# Patient Record
Sex: Male | Born: 1940 | Race: White | Hispanic: No | Marital: Married | Smoking: Former smoker
Health system: Southern US, Community
[De-identification: ages and names within clinical notes are randomized; demographics above are authoritative.]

## PROBLEM LIST (undated history)

## (undated) DIAGNOSIS — I10 Essential (primary) hypertension: Secondary | ICD-10-CM

## (undated) DIAGNOSIS — I4891 Unspecified atrial fibrillation: Secondary | ICD-10-CM

## (undated) DIAGNOSIS — I219 Acute myocardial infarction, unspecified: Secondary | ICD-10-CM

## (undated) DIAGNOSIS — I2510 Atherosclerotic heart disease of native coronary artery without angina pectoris: Secondary | ICD-10-CM

## (undated) DIAGNOSIS — L409 Psoriasis, unspecified: Secondary | ICD-10-CM

## (undated) DIAGNOSIS — E785 Hyperlipidemia, unspecified: Secondary | ICD-10-CM

## (undated) DIAGNOSIS — G4730 Sleep apnea, unspecified: Secondary | ICD-10-CM

## (undated) DIAGNOSIS — K635 Polyp of colon: Secondary | ICD-10-CM

## (undated) DIAGNOSIS — F17201 Nicotine dependence, unspecified, in remission: Secondary | ICD-10-CM

## (undated) DIAGNOSIS — I428 Other cardiomyopathies: Secondary | ICD-10-CM

## (undated) DIAGNOSIS — N189 Chronic kidney disease, unspecified: Secondary | ICD-10-CM

## (undated) DIAGNOSIS — E119 Type 2 diabetes mellitus without complications: Secondary | ICD-10-CM

## (undated) HISTORY — DX: Hyperlipidemia, unspecified: E78.5

## (undated) HISTORY — DX: Essential (primary) hypertension: I10

## (undated) HISTORY — PX: PARTIAL NEPHRECTOMY: SHX414

## (undated) HISTORY — PX: CARDIAC DEFIBRILLATOR PLACEMENT: SHX171

## (undated) HISTORY — DX: Nicotine dependence, unspecified, in remission: F17.201

## (undated) HISTORY — DX: Other cardiomyopathies: I42.8

## (undated) HISTORY — DX: Type 2 diabetes mellitus without complications: E11.9

## (undated) HISTORY — DX: Atherosclerotic heart disease of native coronary artery without angina pectoris: I25.10

## (undated) HISTORY — DX: Polyp of colon: K63.5

## (undated) HISTORY — DX: Unspecified atrial fibrillation: I48.91

## (undated) HISTORY — DX: Psoriasis, unspecified: L40.9

## (undated) HISTORY — DX: Chronic kidney disease, unspecified: N18.9

---

## 1993-10-11 DIAGNOSIS — F17201 Nicotine dependence, unspecified, in remission: Secondary | ICD-10-CM

## 1993-10-11 HISTORY — DX: Nicotine dependence, unspecified, in remission: F17.201

## 2004-01-31 ENCOUNTER — Inpatient Hospital Stay (HOSPITAL_COMMUNITY): Admission: AD | Admit: 2004-01-31 | Discharge: 2004-02-06 | Payer: Self-pay | Admitting: *Deleted

## 2004-08-26 ENCOUNTER — Ambulatory Visit: Payer: Self-pay | Admitting: *Deleted

## 2004-09-16 ENCOUNTER — Ambulatory Visit: Payer: Self-pay | Admitting: *Deleted

## 2004-09-25 ENCOUNTER — Ambulatory Visit: Payer: Self-pay | Admitting: *Deleted

## 2004-10-11 DIAGNOSIS — I10 Essential (primary) hypertension: Secondary | ICD-10-CM

## 2004-10-11 HISTORY — DX: Essential (primary) hypertension: I10

## 2004-10-30 ENCOUNTER — Ambulatory Visit: Payer: Self-pay | Admitting: *Deleted

## 2004-11-11 ENCOUNTER — Ambulatory Visit: Payer: Self-pay | Admitting: Cardiology

## 2004-11-25 ENCOUNTER — Ambulatory Visit: Payer: Self-pay | Admitting: *Deleted

## 2004-12-15 ENCOUNTER — Ambulatory Visit: Payer: Self-pay | Admitting: Cardiology

## 2005-01-14 ENCOUNTER — Ambulatory Visit: Payer: Self-pay | Admitting: Cardiology

## 2005-02-17 ENCOUNTER — Ambulatory Visit: Payer: Self-pay | Admitting: *Deleted

## 2005-03-22 ENCOUNTER — Inpatient Hospital Stay (HOSPITAL_COMMUNITY): Admission: EM | Admit: 2005-03-22 | Discharge: 2005-03-28 | Payer: Self-pay | Admitting: Emergency Medicine

## 2005-03-23 ENCOUNTER — Ambulatory Visit: Payer: Self-pay | Admitting: Internal Medicine

## 2005-03-25 ENCOUNTER — Ambulatory Visit: Payer: Self-pay | Admitting: Cardiology

## 2005-04-07 ENCOUNTER — Ambulatory Visit: Payer: Self-pay | Admitting: *Deleted

## 2005-06-01 ENCOUNTER — Inpatient Hospital Stay (HOSPITAL_COMMUNITY): Admission: RE | Admit: 2005-06-01 | Discharge: 2005-06-04 | Payer: Self-pay | Admitting: Urology

## 2005-06-01 ENCOUNTER — Encounter (INDEPENDENT_AMBULATORY_CARE_PROVIDER_SITE_OTHER): Payer: Self-pay | Admitting: *Deleted

## 2005-07-27 ENCOUNTER — Ambulatory Visit: Payer: Self-pay | Admitting: *Deleted

## 2005-07-28 ENCOUNTER — Ambulatory Visit (HOSPITAL_COMMUNITY): Admission: RE | Admit: 2005-07-28 | Discharge: 2005-07-28 | Payer: Self-pay | Admitting: *Deleted

## 2005-07-28 ENCOUNTER — Ambulatory Visit: Payer: Self-pay | Admitting: *Deleted

## 2005-08-04 ENCOUNTER — Ambulatory Visit: Payer: Self-pay | Admitting: *Deleted

## 2005-08-24 ENCOUNTER — Inpatient Hospital Stay (HOSPITAL_COMMUNITY): Admission: RE | Admit: 2005-08-24 | Discharge: 2005-08-25 | Payer: Self-pay | Admitting: Internal Medicine

## 2005-08-24 ENCOUNTER — Ambulatory Visit: Payer: Self-pay | Admitting: Internal Medicine

## 2005-09-09 ENCOUNTER — Ambulatory Visit: Payer: Self-pay

## 2005-11-30 ENCOUNTER — Ambulatory Visit: Payer: Self-pay | Admitting: Internal Medicine

## 2006-03-01 ENCOUNTER — Ambulatory Visit: Payer: Self-pay | Admitting: Internal Medicine

## 2006-05-17 ENCOUNTER — Ambulatory Visit: Payer: Self-pay | Admitting: *Deleted

## 2006-05-26 ENCOUNTER — Ambulatory Visit: Payer: Self-pay

## 2006-07-13 IMAGING — CR DG CHEST 1V PORT
1 series · 1 of 1 positions shown · non-contrast
Comparison: 03/23/2005

CLINICAL DATA: Shortness of breath

PORTABLE CHEST - 1 VIEW:

[view not recorded]
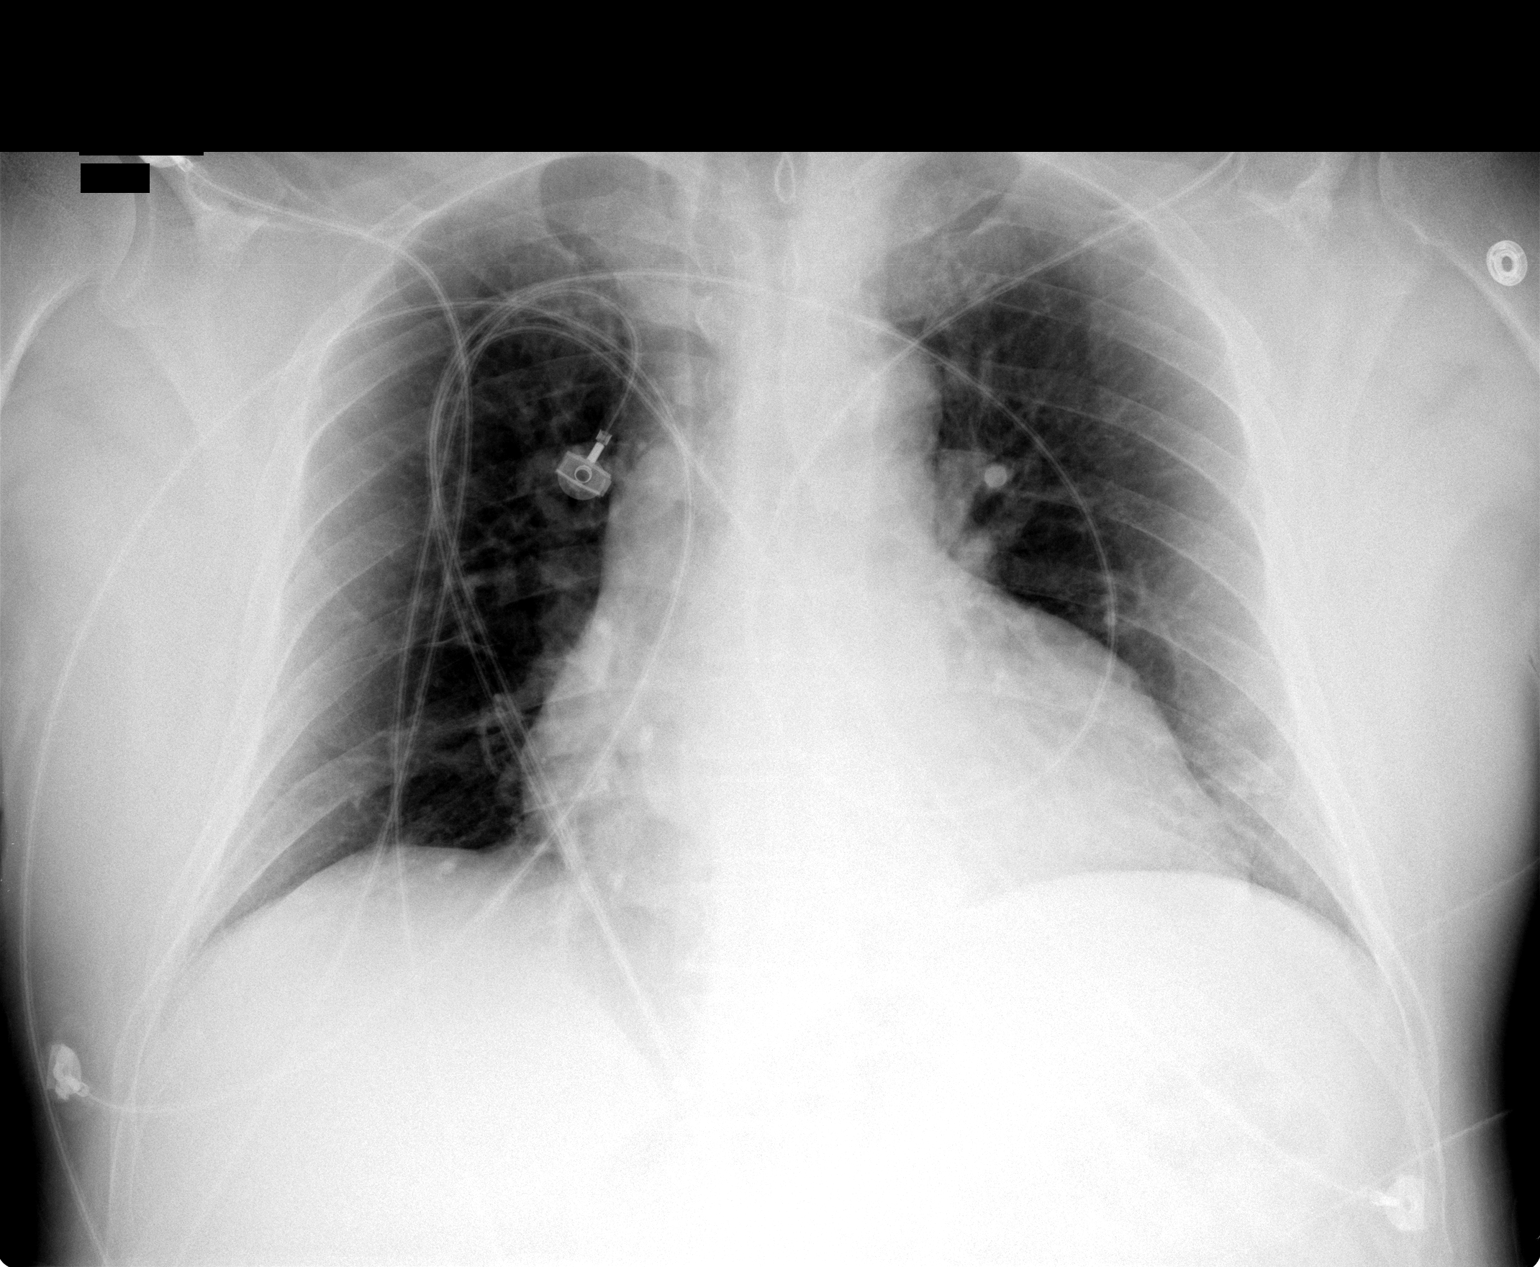

[1 of 1 positions shown; findings below may reference images not displayed]

FINDINGS: There is cardiomegaly. Low lung volumes are noted with no focal
airspace opacity. No effusion.
IMPRESSION: Cardiomegaly. Low lung volumes. No active disease.

## 2006-08-12 ENCOUNTER — Ambulatory Visit: Payer: Self-pay

## 2006-08-30 ENCOUNTER — Ambulatory Visit: Payer: Self-pay | Admitting: Internal Medicine

## 2006-09-07 ENCOUNTER — Ambulatory Visit: Payer: Self-pay | Admitting: Internal Medicine

## 2006-11-30 ENCOUNTER — Ambulatory Visit: Payer: Self-pay | Admitting: Internal Medicine

## 2006-12-14 IMAGING — CR DG CHEST 2V
2 series · 2 of 2 positions shown · non-contrast
Comparison: none

CLINICAL DATA: Left ventricular dysfunction. Hypertension. Reformed smoker. Pre-ICD lead placement.
 CHEST- 2 VIEW:

[view not recorded (1 of 2)]
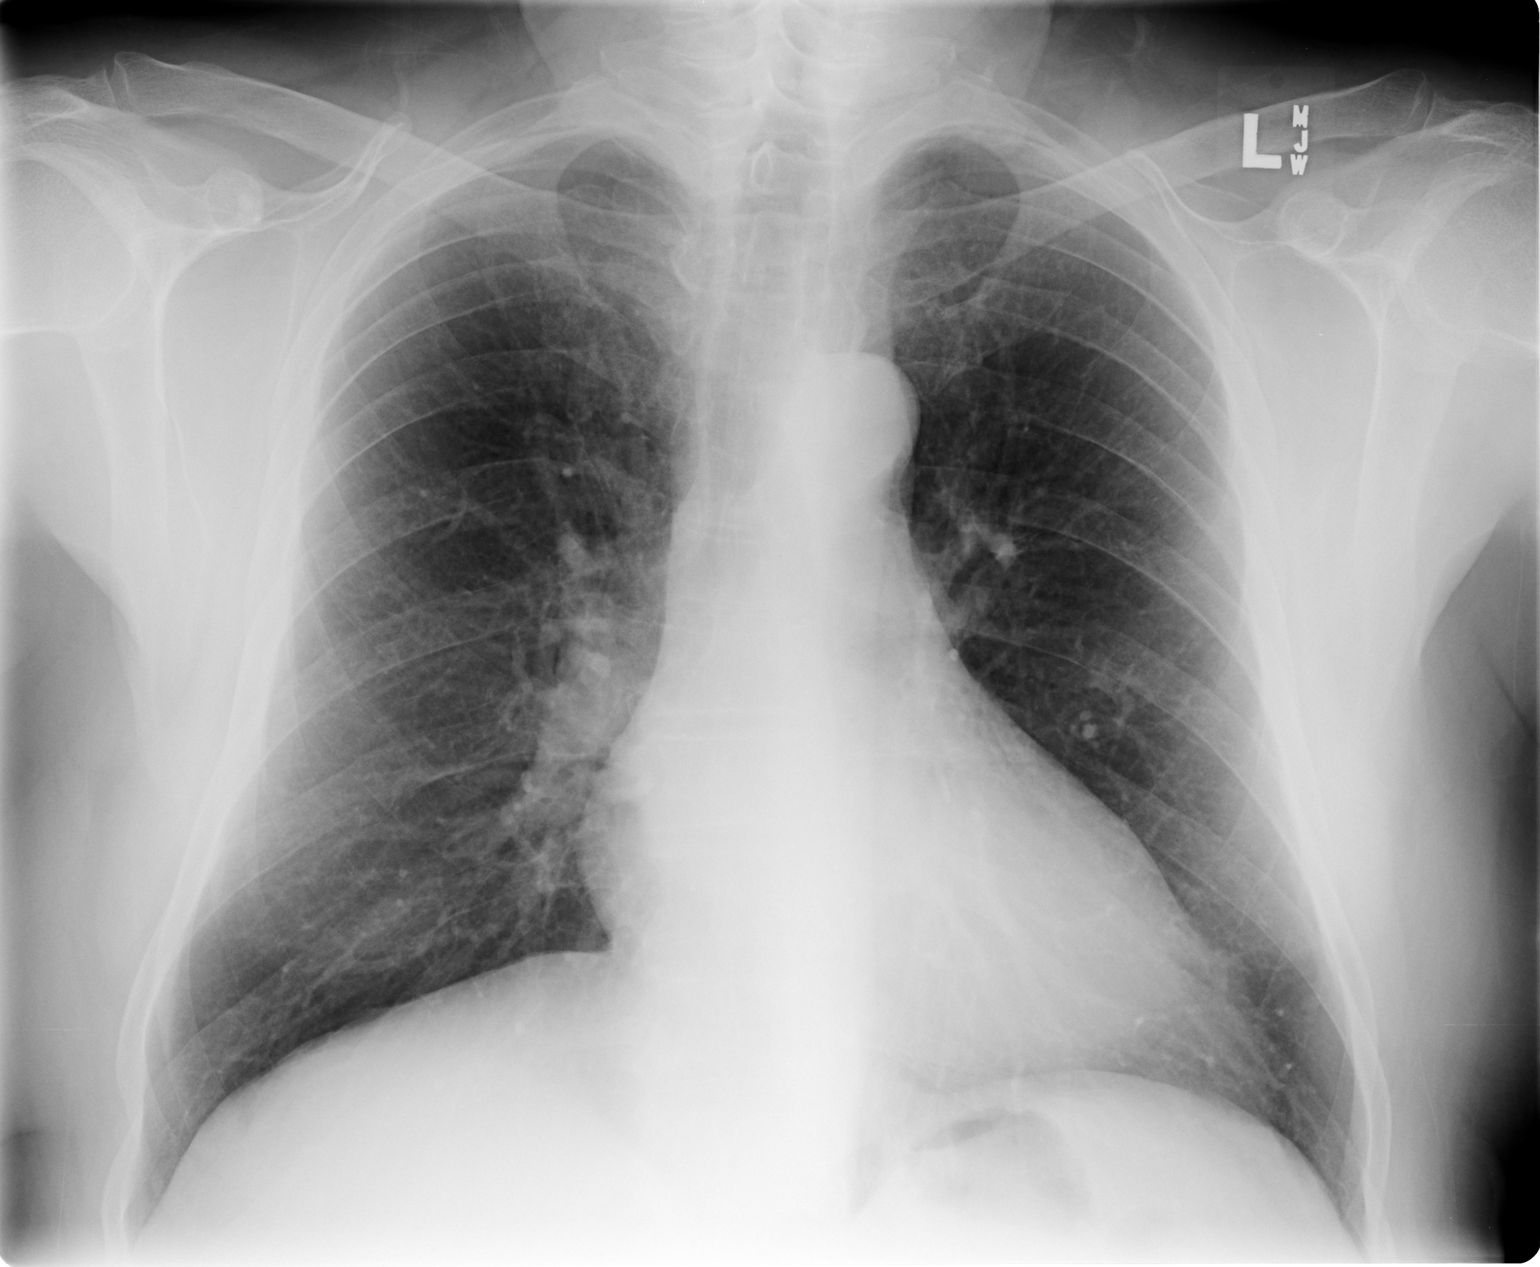

[view not recorded (2 of 2)]
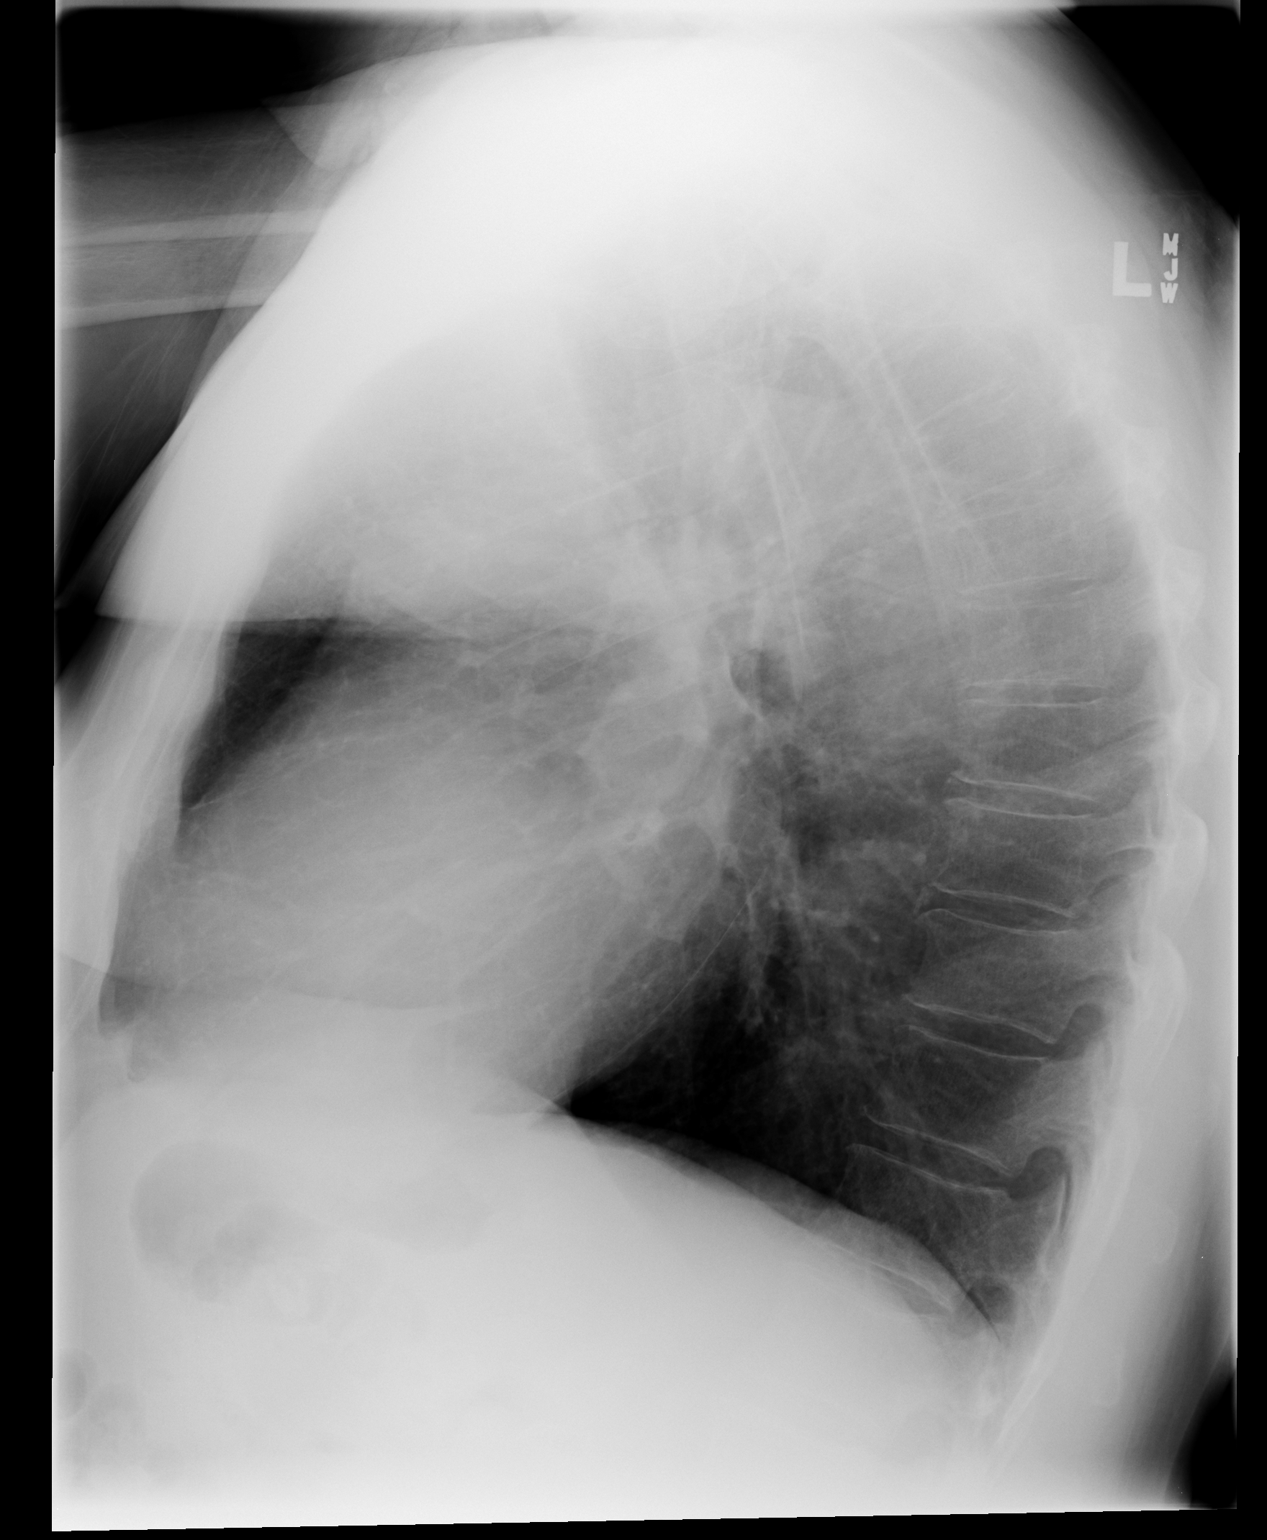

[2 of 2 positions shown; findings below may reference images not displayed]

FINDINGS: PA and lateral views of the chest are made and are compared to a previous portable study of 03/24/05 at 1091 hours and shows improved lung aeration.  There is noted mild diffuse peribronchial thickening with some flattening of the hemidiaphragms and increased AP diameter of the chest consistent with mild COPD. The heart, and mediastinum are within normal limits.  There is no edema, fusion or pneumothorax.  The bones appear to be within the limits of normal.
IMPRESSION: Mild COPD. No evidence of active cardiopulmonary disease.

## 2006-12-15 IMAGING — CR DG CHEST 2V
2 series · 2 of 2 positions shown · non-contrast
Comparison: none

CLINICAL DATA: post-AICD; some soreness
 [id number] VIEWS:

[w chest pa]
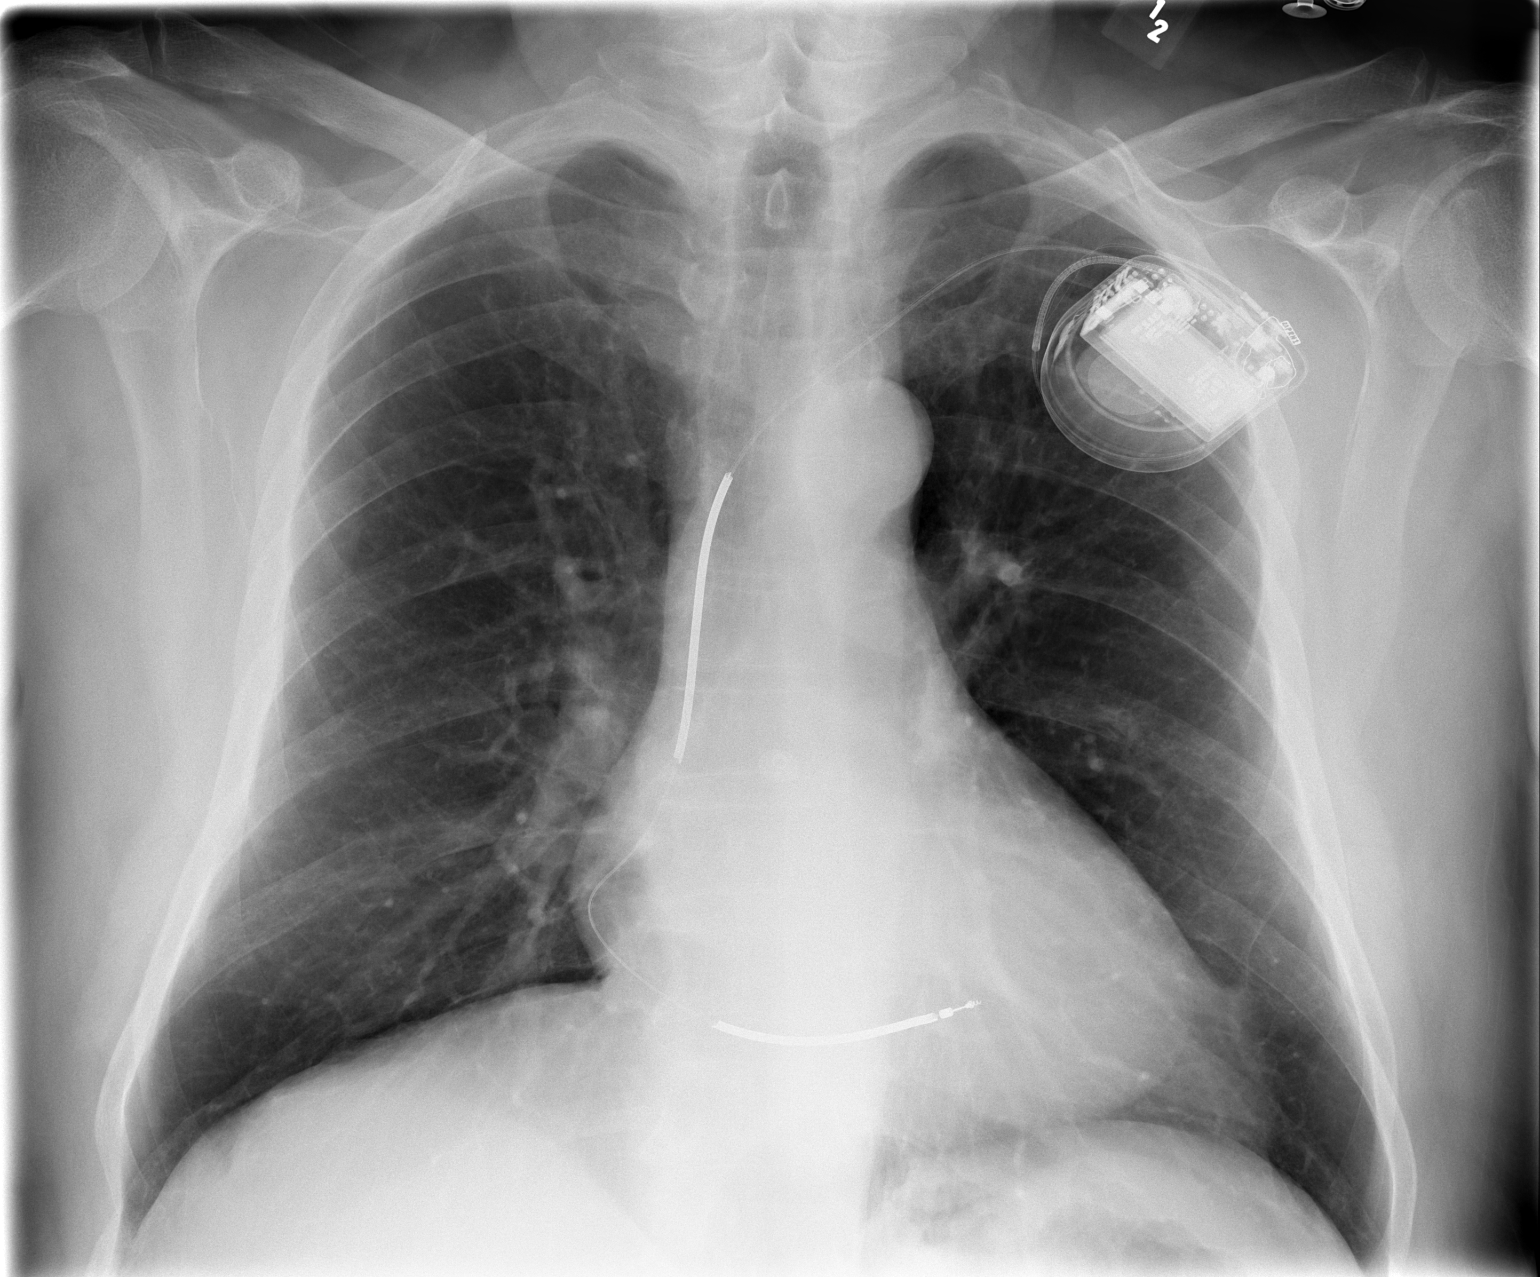

[w chest lat]
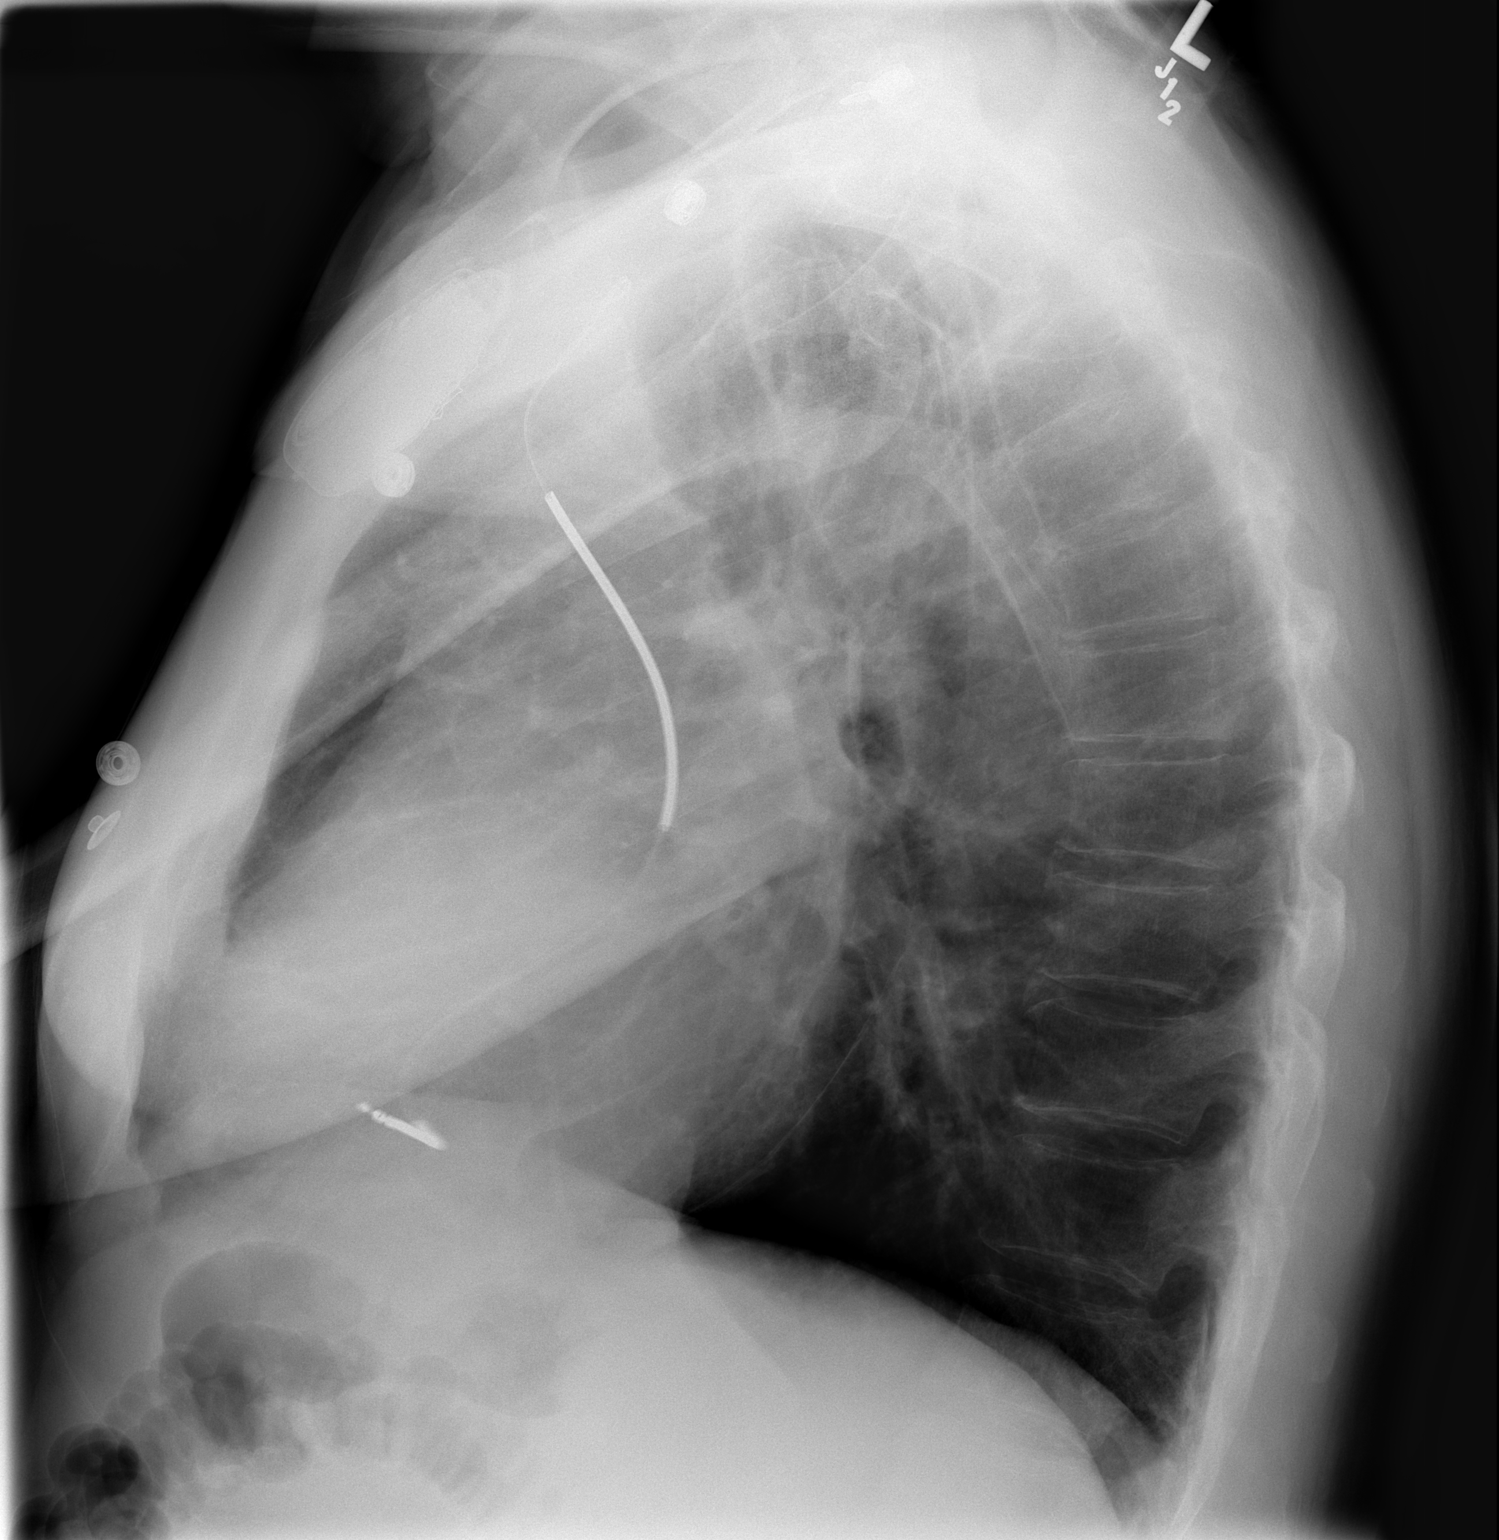

[2 of 2 positions shown; findings below may reference images not displayed]

FINDINGS: PA and lateral views of the chest are made and are compared to the previous study of 08/24/05 and now show the AICD to be in good position.  There is no pneumothorax or edema.  The cardiopericardial silhouette again is seen to be within the limits of normal.  The lungs show stable mild COPD.
IMPRESSION: Good position new AICD.  No pneumothorax.  Mild stable COPD.

## 2007-01-09 ENCOUNTER — Ambulatory Visit: Payer: Self-pay | Admitting: Internal Medicine

## 2007-01-18 ENCOUNTER — Ambulatory Visit: Payer: Self-pay | Admitting: Internal Medicine

## 2007-02-20 ENCOUNTER — Ambulatory Visit: Payer: Self-pay | Admitting: Internal Medicine

## 2007-04-18 ENCOUNTER — Ambulatory Visit: Payer: Self-pay | Admitting: Internal Medicine

## 2007-07-18 ENCOUNTER — Ambulatory Visit: Payer: Self-pay | Admitting: Internal Medicine

## 2007-07-20 ENCOUNTER — Ambulatory Visit: Payer: Self-pay | Admitting: Internal Medicine

## 2007-10-18 ENCOUNTER — Ambulatory Visit: Payer: Self-pay | Admitting: Internal Medicine

## 2007-11-20 ENCOUNTER — Ambulatory Visit: Payer: Self-pay | Admitting: Internal Medicine

## 2008-01-18 ENCOUNTER — Ambulatory Visit: Payer: Self-pay | Admitting: Internal Medicine

## 2008-02-07 ENCOUNTER — Ambulatory Visit: Payer: Self-pay | Admitting: Internal Medicine

## 2008-03-26 ENCOUNTER — Encounter (INDEPENDENT_AMBULATORY_CARE_PROVIDER_SITE_OTHER): Payer: Self-pay | Admitting: *Deleted

## 2008-03-26 LAB — CONVERTED CEMR LAB
BUN: 28 mg/dL
CO2: 25 meq/L
Calcium: 9.5 mg/dL
Chloride: 107 meq/L
Creatinine, Ser: 0.89 mg/dL
Glucose, Bld: 128 mg/dL
Potassium: 4.6 meq/L
Sodium: 144 meq/L

## 2008-05-07 ENCOUNTER — Ambulatory Visit: Payer: Self-pay | Admitting: Internal Medicine

## 2008-07-18 ENCOUNTER — Ambulatory Visit: Payer: Self-pay | Admitting: Cardiology

## 2008-07-23 ENCOUNTER — Ambulatory Visit (HOSPITAL_COMMUNITY): Admission: RE | Admit: 2008-07-23 | Discharge: 2008-07-23 | Payer: Self-pay | Admitting: Cardiology

## 2008-07-23 ENCOUNTER — Encounter: Payer: Self-pay | Admitting: Cardiology

## 2008-07-23 ENCOUNTER — Ambulatory Visit: Payer: Self-pay | Admitting: Cardiology

## 2008-08-22 ENCOUNTER — Ambulatory Visit: Payer: Self-pay | Admitting: Internal Medicine

## 2008-11-19 ENCOUNTER — Ambulatory Visit: Payer: Self-pay | Admitting: Internal Medicine

## 2009-01-07 ENCOUNTER — Encounter: Payer: Self-pay | Admitting: Internal Medicine

## 2009-03-11 DIAGNOSIS — N189 Chronic kidney disease, unspecified: Secondary | ICD-10-CM | POA: Insufficient documentation

## 2009-03-11 DIAGNOSIS — Z95810 Presence of automatic (implantable) cardiac defibrillator: Secondary | ICD-10-CM | POA: Insufficient documentation

## 2009-03-12 ENCOUNTER — Encounter: Payer: Self-pay | Admitting: Internal Medicine

## 2009-03-12 ENCOUNTER — Ambulatory Visit: Payer: Self-pay | Admitting: Internal Medicine

## 2009-06-17 ENCOUNTER — Ambulatory Visit: Payer: Self-pay | Admitting: Internal Medicine

## 2009-07-17 ENCOUNTER — Encounter: Payer: Self-pay | Admitting: Internal Medicine

## 2009-09-16 ENCOUNTER — Encounter: Payer: Self-pay | Admitting: Internal Medicine

## 2009-10-06 ENCOUNTER — Encounter: Payer: Self-pay | Admitting: Internal Medicine

## 2009-11-19 ENCOUNTER — Telehealth (INDEPENDENT_AMBULATORY_CARE_PROVIDER_SITE_OTHER): Payer: Self-pay

## 2009-12-16 ENCOUNTER — Ambulatory Visit: Payer: Self-pay | Admitting: Internal Medicine

## 2009-12-23 ENCOUNTER — Encounter: Payer: Self-pay | Admitting: Internal Medicine

## 2009-12-30 ENCOUNTER — Encounter (INDEPENDENT_AMBULATORY_CARE_PROVIDER_SITE_OTHER): Payer: Self-pay | Admitting: *Deleted

## 2010-01-02 ENCOUNTER — Encounter (INDEPENDENT_AMBULATORY_CARE_PROVIDER_SITE_OTHER): Payer: Self-pay | Admitting: *Deleted

## 2010-01-02 ENCOUNTER — Ambulatory Visit: Payer: Self-pay | Admitting: Cardiology

## 2010-01-02 DIAGNOSIS — D126 Benign neoplasm of colon, unspecified: Secondary | ICD-10-CM

## 2010-03-04 ENCOUNTER — Telehealth (INDEPENDENT_AMBULATORY_CARE_PROVIDER_SITE_OTHER): Payer: Self-pay | Admitting: *Deleted

## 2010-03-18 ENCOUNTER — Ambulatory Visit: Payer: Self-pay | Admitting: Internal Medicine

## 2010-03-19 ENCOUNTER — Encounter: Payer: Self-pay | Admitting: Internal Medicine

## 2010-04-16 ENCOUNTER — Encounter: Payer: Self-pay | Admitting: Cardiology

## 2010-04-16 LAB — CONVERTED CEMR LAB
ALT: 20 units/L (ref 0–53)
AST: 19 units/L (ref 0–37)
Albumin: 4.6 g/dL (ref 3.5–5.2)
Alkaline Phosphatase: 47 units/L (ref 39–117)
BUN: 19 mg/dL (ref 6–23)
Basophils Absolute: 0 10*3/uL (ref 0.0–0.1)
Basophils Relative: 1 % (ref 0–1)
CO2: 24 meq/L (ref 19–32)
Calcium: 9.4 mg/dL (ref 8.4–10.5)
Chloride: 104 meq/L (ref 96–112)
Cholesterol: 136 mg/dL (ref 0–200)
Creatinine, Ser: 0.9 mg/dL (ref 0.40–1.50)
Eosinophils Absolute: 0.2 10*3/uL (ref 0.0–0.7)
Eosinophils Relative: 3 % (ref 0–5)
Glucose, Bld: 144 mg/dL — ABNORMAL HIGH (ref 70–99)
HCT: 45.8 % (ref 39.0–52.0)
HDL: 36 mg/dL — ABNORMAL LOW (ref >39)
Hemoglobin: 15 g/dL (ref 13.0–17.0)
LDL Cholesterol: 76 mg/dL (ref 0–99)
Lymphocytes Relative: 28 % (ref 12–46)
Lymphs Abs: 2.3 10*3/uL (ref 0.7–4.0)
MCHC: 32.8 g/dL (ref 30.0–36.0)
MCV: 91.8 fL (ref 78.0–100.0)
Monocytes Absolute: 0.6 10*3/uL (ref 0.1–1.0)
Monocytes Relative: 7 % (ref 3–12)
Neutro Abs: 5 10*3/uL (ref 1.7–7.7)
Neutrophils Relative %: 62 % (ref 43–77)
Platelets: 272 10*3/uL (ref 150–400)
Potassium: 4.8 meq/L (ref 3.5–5.3)
RBC: 4.99 M/uL (ref 4.22–5.81)
RDW: 13.5 % (ref 11.5–15.5)
Sodium: 139 meq/L (ref 135–145)
Total Bilirubin: 0.4 mg/dL (ref 0.3–1.2)
Total CHOL/HDL Ratio: 3.8
Total Protein: 7.4 g/dL (ref 6.0–8.3)
Triglycerides: 118 mg/dL (ref <150)
VLDL: 24 mg/dL (ref 0–40)
WBC: 8.1 10*3/uL (ref 4.0–10.5)

## 2010-06-18 ENCOUNTER — Ambulatory Visit: Payer: Self-pay | Admitting: Internal Medicine

## 2010-06-19 ENCOUNTER — Encounter: Payer: Self-pay | Admitting: Internal Medicine

## 2010-06-29 ENCOUNTER — Encounter: Payer: Self-pay | Admitting: Internal Medicine

## 2010-09-24 ENCOUNTER — Ambulatory Visit: Payer: Self-pay | Admitting: Internal Medicine

## 2010-10-11 HISTORY — PX: COLONOSCOPY: SHX174

## 2010-10-20 ENCOUNTER — Encounter (INDEPENDENT_AMBULATORY_CARE_PROVIDER_SITE_OTHER): Payer: Self-pay | Admitting: *Deleted

## 2010-11-10 NOTE — Cardiovascular Report (Signed)
Summary: Office Visit Remote   Office Visit Remote   Imported By: Roderic Ovens 06/30/2010 12:42:06  _____________________________________________________________________  External Attachment:    Type:   Image     Comment:   External Document

## 2010-11-10 NOTE — Assessment & Plan Note (Signed)
Summary: 1 yr f/u per checkout on 03/12/09/tg      Allergies Added: NKDA  Visit Type:  Pacemaker check Primary Provider:  Barnet Pall   History of Present Illness: Return visit is scheduled for this very pleasant 70 year old gentleman with atrial fibrillation, nonischemic cardiomyopathy and mild incidental coronary artery disease.  Since I last saw him a year ago, he has done fine.  He is active, including performing chores around the house, without any cardiopulmonary symptoms.  He does not note palpitations.  Blood pressure control has been good. He has had no intercurrent ICD therapies.  Preventive Screening-Counseling & Management  Alcohol-Tobacco     Smoking Status: quit     Year Quit: 1990  Current Medications (verified): 1)  Coreg 25 Mg Tabs (Carvedilol) .... Two By Mouth Two Times A Day 2)  Simvastatin 20 Mg Tabs (Simvastatin) .... Take One Tablet By Mouth Daily At Bedtime 3)  Diovan 320 Mg Tabs (Valsartan) .... One By Mouth Daily 4)  Furosemide 20 Mg Tabs (Furosemide) .... One By Mouth Every 2 Days 5)  Aldactone 25 Mg Tabs (Spironolactone) .... One Half By Mouth Daily 6)  Digoxin 0.125 Mg Tabs (Digoxin) .... On By Mouth Daily 7)  Amlodipine Besylate 5 Mg Tabs (Amlodipine Besylate) .... One By Mouth Daily 8)  Aspirin Ec 325 Mg Tbec (Aspirin) .... One By Mouth Daily 9)  Tylenol Pm Extra Strength 500-25 Mg Tabs (Diphenhydramine-Apap (Sleep)) .... 2 By Mouth Daily  Allergies (verified): No Known Drug Allergies  Comments:  Nurse/Medical Assistant: The patient is currently on medications but does not know the name or dosage at this time. Instructed to contact our office with details. Will update medication list at that time.  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 nl in 2009 HYPERLIPIDEMIA (ICD-272.4) Polyps-colon PSORIASIS (ICD-696.1)  Past Surgical History: Last updated: 01/02/2010 Nephrectomy-partial defibrillator insertion-2006  Review of Systems  The patient denies chest pain, syncope, dyspnea on exertion, and peripheral edema.    Vital Signs:  Patient profile:   70 year old male Height:      72 inches Weight:      226 pounds O2 Sat:      95 % on Room air Pulse rate:   74 / minute BP sitting:   127 / 78  (left arm) Cuff size:   large  Vitals Entered By: Carlye Grippe (March 18, 2010 3:54 PM)  O2 Flow:  Room air  Physical Exam  General:    Proportionate weight and height; well developed; no acute distress:   Weight-233, 16 pounds more than last year. Neck-No JVD; no carotid bruits: Lungs-No tachypnea, no rales; no rhonchi; no wheezes: Cardiovascular-normal PMI; normal S1 and S2; irregular rhythm; minimal early systolic murmur Abdomen-BS normal; soft and non-tender without masses or organomegaly:  Musculoskeletal-No deformities, no cyanosis or clubbing: Neurologic-Normal cranial nerves; symmetric strength and tone:  Skin-Warm, very mild psoriasis over the dorsal surface of the arms Extremities-Nl distal pulses; no edema:      ICD Specifications Following MD:  Lewayne Bunting, MD     ICD Vendor:  Medtronic     ICD Model Number:  [phone]  ICD Serial Number:  [id number] ICD DOI:  08/24/2005     ICD Implanting MD:  Lewayne Bunting, MD  Lead 1:    Location: RV     DOI: 08/24/2005     Model #: [id number]     Serial #: [id number]     Status: active  Indications::  NICM   ICD Follow Up Remote Check?  No Battery Voltage:  3.02 V     Charge Time:  8.58 seconds     Underlying rhythm:  SR ICD Dependent:  No         ICD Device Measurements Right Ventricle:  Amplitude: 14.6 mV, Impedance: 504 ohms, Threshold: 1.0 V at 0.2 msec Shock Impedance: 55/68 ohms   Episodes Coumadin:  No Shock:  0     ATP:  0     Nonsustained:  287     Ventricular Pacing:  0.9%  Brady Parameters Mode VVI     Lower Rate Limit:  40      Tachy Zones VF:  200     VT:  250     VT1:  182     Next Remote Date:  06/18/2010     Next Cardiology Appt Due:  03/12/2011 Tech Comments:  No parameter changes.  Device function normal.  The longest NSVT episode was 10 seconds. 6949 lead stable, SIC 0.  Alert tones demonstrated for the patient.  Updated letter and magnet given to patient.    [facility] transmissions every 3 months.  ROV 1 year with Dr. Ladona Ridgel in [place].   MD Comments:  Agree with above.  Impression & Recommendations:  Problem # 1:  AUTOMATIC IMPLANTABLE CARDIAC DEFIBRILLATOR SITU (ICD-V45.02) His device is working normally.  will recheck in several months.  Problem # 2:  HYPERTENSION (ICD-401.9) His blood pressure has been well controlled. Continue a low sodium diet. His updated medication list for this problem includes:    Coreg 25 Mg Tabs (Carvedilol) .Marland Kitchen..Marland Kitchen Two by mouth two times a day    Diovan 320 Mg Tabs (Valsartan) ..... One by mouth daily    Furosemide 20 Mg Tabs (Furosemide) ..... One by mouth every 2 days    Aldactone 25 Mg Tabs (Spironolactone) ..... One half by mouth daily    Amlodipine Besylate 5 Mg Tabs (Amlodipine besylate) ..... One by mouth daily    Aspirin Ec 325 Mg Tbec (Aspirin) ..... One by mouth daily  Problem # 3:  HYPERLIPIDEMIA (ICD-272.4) A low fat diet and weight loss has been recommended. His updated medication list for this problem includes:    Simvastatin 20 Mg Tabs (Simvastatin) .Marland Kitchen... Take one tablet by mouth daily at bedtime

## 2010-11-10 NOTE — Letter (Signed)
Summary: [facility], [facility] at [employer]  [address] [place] [address]   Phone: [phone]  Fax: [phone]     January 02, 2010 MRN: [id number]   [name] [address] [place], [place]  [address]      YOUR LAB WORK IS DUE   _____________July 25, 2011____________________________  Please go to [facility], located across the street from [facility] on the second floor.  Hours are Monday - Friday 7am until 7:30pm         Saturday 8am until 12noon    _x_  DO NOT EAT OR DRINK AFTER MIDNIGHT EVENING PRIOR TO LABWORK  __ YOUR LABWORK IS NOT FASTING --YOU MAY EAT PRIOR TO LABWORK

## 2010-11-10 NOTE — Cardiovascular Report (Signed)
Summary: Office Visit Remote   Office Visit Remote   Imported By: Roderic Ovens 12/24/2009 11:06:20  _____________________________________________________________________  External Attachment:    Type:   Image     Comment:   External Document

## 2010-11-10 NOTE — Miscellaneous (Signed)
Summary: LABS BMP 03/26/2008  Clinical Lists Changes  Observations: Added new observation of CALCIUM: 9.5 mg/dL (74/25/9563 8:75) Added new observation of CREATININE: 0.89 mg/dL (64/33/2951 8:84) Added new observation of BUN: 28 mg/dL (16/60/6301 6:01) Added new observation of BG RANDOM: 128 mg/dL (09/32/3557 3:22) Added new observation of CO2 PLSM/SER: 25 meq/L (03/26/2008 8:19) Added new observation of CL SERUM: 107 meq/L (03/26/2008 8:19) Added new observation of K SERUM: 4.6 meq/L (03/26/2008 8:19) Added new observation of NA: 144 meq/L (03/26/2008 8:19)

## 2010-11-10 NOTE — Letter (Signed)
Summary: Remote Device Check  [facility], Main Office  [address]   [place], [place] [address]   Phone: [phone]  Fax: [phone]     June 29, 2010 MRN: [id number]   [name] [address] [place], [place]  [address]   Dear Mr. PASCH,   Your remote transmission was recieved and reviewed by your physician.  All diagnostics were within normal limits for you.  __X___Your next transmission is scheduled for: 09-24-2010.  Please transmit at any time this day.  If you have a wireless device your transmission will be sent automatically.   Sincerely,  Vella Kohler

## 2010-11-10 NOTE — Assessment & Plan Note (Signed)
Summary: rov  Medications Added SIMVASTATIN 20 MG TABS (SIMVASTATIN) Take one tablet by mouth daily at bedtime      Allergies Added: NKDA  Visit Type:  Follow-up Primary Provider:  Barnet Pall   History of Present Illness: Return visit is scheduled for this very pleasant 70 year old gentleman with atrial fibrillation, nonischemic cardiomyopathy and mild incidental coronary artery disease.  Since I last saw him a year ago, he has done fine.  He is active, including performing chores around the house, without any cardiopulmonary symptoms.  He does not note palpitations.  Blood pressure control has been good.  Psoriasis has been mild.  Followup of his AICD is current.  The device has never discharged.  Current Medications (verified): 1)  Coreg 25 Mg Tabs (Carvedilol) .... Two By Mouth Two Times A Day 2)  Simvastatin 20 Mg Tabs (Simvastatin) .... Take One Tablet By Mouth Daily At Bedtime 3)  Diovan 320 Mg Tabs (Valsartan) .... One By Mouth Daily 4)  Furosemide 20 Mg Tabs (Furosemide) .... One By Mouth Every 2 Days 5)  Aldactone 25 Mg Tabs (Spironolactone) .... One Half By Mouth Daily 6)  Digoxin 0.125 Mg Tabs (Digoxin) .... On By Mouth Daily 7)  Amlodipine Besylate 5 Mg Tabs (Amlodipine Besylate) .... One By Mouth Daily 8)  Aspirin Ec 325 Mg Tbec (Aspirin) .... One By Mouth Daily 9)  Tylenol Pm Extra Strength 500-25 Mg Tabs (Diphenhydramine-Apap (Sleep)) .... 2 By Mouth Daily  Allergies (verified): No Known Drug Allergies  Past History:  PMH, FH, and Social History reviewed and updated.  Review of Systems       See history of present illness.  Vital Signs:  Patient profile:   70 year old male Height:      72 inches Weight:      233 pounds BMI:     31.71 Pulse rate:   87 / minute BP sitting:   118 / 67  (right arm)  Vitals Entered By: Dreama Saa, CNA (January 02, 2010 1:41 PM)  Physical Exam  General:    Proportionate weight and height; well developed; no  acute distress:   Weight-233, 16 pounds more than last year. Neck-No JVD; no carotid bruits: Lungs-No tachypnea, no rales; no rhonchi; no wheezes: Cardiovascular-normal PMI; normal S1 and S2; irregular rhythm; minimal early systolic murmur Abdomen-BS normal; soft and non-tender without masses or organomegaly:  Musculoskeletal-No deformities, no cyanosis or clubbing: Neurologic-Normal cranial nerves; symmetric strength and tone:  Skin-Warm, very mild psoriasis over the dorsal surface of the arms Extremities-Nl distal pulses; no edema:      ICD Specifications Following MD:  Lewayne Bunting, MD     ICD Vendor:  Medtronic     ICD Model Number:  7232     ICD Serial Number:  [id number] ICD DOI:  08/24/2005     ICD Implanting MD:  Lewayne Bunting, MD  Lead 1:    Location: RV     DOI: 08/24/2005     Model #: [id number]     Serial #: [id number]     Status: active  Indications::  NICM   ICD Follow Up ICD Dependent:  No      Episodes Coumadin:  No  Brady Parameters Mode VVI     Lower Rate Limit:  40      Tachy Zones VF:  200     VT:  250     VT1:  182     Impression & Recommendations:  Problem # 1:  ATRIAL  FIBRILLATION (ICD-427.31) Heart rate is controlled in atrial fibrillation.  Patient's risk of thromboembolism is above average, but not extremely high.  After experiencing an upper GI bleed, severe epistaxis and a subcapsular renal bleed resulting in partial nephrectomy, he is disinclined to use anticoagulants.  He will continue on full dose aspirin and report any significant symptoms that could represent arterial embolism.  Problem # 2:  CARDIOMYOPATHY (ICD-425.4) No clinical congestive heart failure at present.  In that setting, the exact contribution of this problem to his risk of thromboembolism is uncertain.  Problem # 3:  HYPERLIPIDEMIA (ICD-272.4) Patient requests a less expensive statin.  Since he is on low-dose rosuvastatin, other drugs can easily be substituted.  We will start with  simvastatin 20 mg q.d.  If necessary, the dose could be increased to 40 mg.  A lipid profile will be obtained after he has taken this new medication for one month.  Other basic laboratory including a CBC chemistry profile will also be drawn.  Problem # 4:  HYPERTENSION (ICD-401.9) Blood pressure control is good.  Current medications will be continued.  I plan to see this nice gentleman again in one year.  Other Orders: Future Orders: T-Comprehensive Metabolic Panel ([id number]) ... 05/04/2010 T-CBC w/Diff ([id number]) ... 05/04/2010 T-Lipid Profile [phone]) ... 05/04/2010  Patient Instructions: 1)  Your physician recommends that you schedule a follow-up appointment in: 1 year 2)  Your physician recommends that you return for a FASTING lipid profile: 4 months 3)  Your physician has recommended you make the following change in your medication:  change crestor to simvastatin 20mg  daily will not need a script for 3 months

## 2010-11-10 NOTE — Progress Notes (Signed)
Summary: ALL RX NEED CALLED IN FOR 3 MONTHS   Phone Note Call from Patient Call back at Home Phone [phone]   Caller: PT Reason for Call: Refill Medication Summary of Call: PT NEEDS ALL MEDS CALLED IN WITH REFILLS TO [facility]. Initial call taken by: Faythe Ghee,  November 19, 2009 10:55 AM    Prescriptions: AMLODIPINE BESYLATE 5 MG TABS (AMLODIPINE BESYLATE) one by mouth daily  #90 x 3   Entered by:   Larita Fife Via LPN   Authorized by:   Kathlen [place], MD, [facility]   Signed by:   Larita Fife Via LPN on 09/81/1914   Method used:   Faxed to ...       [facility]             , [place]         Ph:        Fax: [phone]   RxID:   [id number] DIGOXIN 0.125 MG TABS (DIGOXIN) on by mouth daily  #90 x 3   Entered by:   Larita Fife Via LPN   Authorized by:   Kathlen [place], MD, [facility]   Signed by:   Larita Fife Via LPN on 32/44/0102   Method used:   Faxed to ...       [facility]ail[facility]             , [place]         Ph:        Fax: [phone]   RxID:   [id number] ALDACTONE 25 MG TABS (SPIRONOLACTONE) one half by mouth daily  #45 x 3   Entered by:   Larita Fife Via LPN   Authorized by:   Kathlen [place], MD, [facility]   Signed by:   Larita Fife Via LPN on 29/51/8841   Method used:   Faxed to ...       [facility]             , [place]         Ph:        Fax: [phone]   RxID:   [id number] DIOVAN 320 MG TABS (VALSARTAN) one by mouth daily  #90 x 3   Entered by:   Larita Fife Via LPN   Authorized by:   Kathlen [place], MD, [facility]   Signed by:   Larita Fife Via LPN on 70/62/3762   Method used:   Faxed to ...       [facility]ail[facility]             , [place]         Ph:        Fax: [phone]   RxID:   [id number] CRESTOR 10 MG TABS (ROSUVASTATIN CALCIUM) one by mouth daily  #90 x 3   Entered by:   Larita Fife Via LPN   Authorized by:   Kathlen [place], MD, [facility]   Signed by:   Larita Fife Via LPN on 03/50/0938   Method used:   Faxed to ...       [facility] ([facility]ail[facility]             , [place]    Ph:        Fax: [phone]   RxID:   [id number] COREG 25 MG TABS (CARVEDILOL) two by mouth two times a day  #360 x 3   Entered by:   Larita Fife Via LPN   Authorized by:   Kathlen [place], MD, [facility]   Signed  byLarita Fife Via LPN on 09/81/1914   Method used:   Faxed to ...       [facility]ail[facility]             , [place]         Ph:        Fax: [phone]   RxID:   [id number]

## 2010-11-10 NOTE — Progress Notes (Signed)
Summary: rx refill   Phone Note Call from Patient Call back at Home Phone [phone]   Caller: pt Reason for Call: Refill Medication Summary of Call: S: [facility]. furosemide 20mg  and simvastatin that he has just been switched out for crestor. Initial call taken by: Faythe Ghee,  Mar 04, 2010 3:35 PM    Prescriptions: FUROSEMIDE 20 MG TABS (FUROSEMIDE) one by mouth every 2 days  #90 x 3   Entered by:   Teressa Lower RN   Authorized by:   Kathlen [place], MD, [facility]   Signed by:   Teressa Lower RN on 03/04/2010   Method used:   Faxed to ...       [facility] ([facility]ail)             , [place]         Ph:        Fax: [phone]   RxID:   [id number] SIMVASTATIN 20 MG TABS (SIMVASTATIN) Take one tablet by mouth daily at bedtime  #90 x 3   Entered by:   Teressa Lower RN   Authorized by:   Kathlen [place], MD, [facility]   Signed by:   Teressa Lower RN on 03/04/2010   Method used:   Faxed to ...       [facility] (retail)             , [place]         Ph:        Fax: [phone]   RxID:   [id number]

## 2010-11-10 NOTE — Letter (Signed)
Summary: Remote Device Check  [facility], Main Office  [address]   [place], [place] [address]   Phone: [phone]  Fax: [phone]     December 23, 2009 MRN: [id number]   [name] [address] [place], [place]  [address]   Dear Mr. BAUMGART,   Your remote transmission was recieved and reviewed by your physician.  All diagnostics were within normal limits for you.    ___X___Your next office visit is scheduled for:    JUNE 2011 WITH DR Ladona Ridgel. Please call our [place] office [phone] to schedule an appointment.    Sincerely,  Proofreader

## 2010-11-12 NOTE — Cardiovascular Report (Signed)
Summary: Office Visit Remote   Office Visit Remote   Imported By: Roderic Ovens 10/23/2010 10:52:04  _____________________________________________________________________  External Attachment:    Type:   Image     Comment:   External Document

## 2010-11-12 NOTE — Letter (Signed)
Summary: Remote Device Check  [facility], Main Office  [address]   [place], [place] [address]   Phone: [phone]  Fax: [phone]     October 20, 2010 MRN: [id number]   [name] [address] [place], [place]  [address]   Dear [name],   Your remote transmission was recieved and reviewed by your physician.  All diagnostics were within normal limits for you.  __X___Your next transmission is scheduled for:   12-24-10 .  Please transmit at any time this day.  If you have a wireless device your transmission will be sent automatically.   Sincerely,  Vella Kohler

## 2010-11-12 NOTE — Letter (Signed)
Summary: Remote Device Check  [facility], Main Office  [address]   [place], [place] [address]   Phone: [phone]  Fax: [phone]     October 20, 2010 MRN: [id number]   [name] [address] [place], [place]  [address]   Dear Mr. WHANG,   Your remote transmission was recieved and reviewed by your physician.  All diagnostics were within normal limits for you.  __X___Your next transmission is scheduled for:  12-17-10.  Please transmit at any time this day.  If you have a wireless device your transmission will be sent automatically.   Sincerely,  Vella Kohler

## 2010-11-20 ENCOUNTER — Telehealth (INDEPENDENT_AMBULATORY_CARE_PROVIDER_SITE_OTHER): Payer: Self-pay

## 2010-12-02 NOTE — Progress Notes (Signed)
**Note De-Identified [name] Obfuscation** Summary: Refills   Phone Note Call from Patient   Caller: Patient Reason for Call: Refill Medication Summary of Call: patient needs refills on Diovan 320mg  / Carvedilol 25mg  / Digoxin 0.125mg  / Spironolactone 25mg  / Amlodipine 5mg  sent to [facility] / patient needs 90 day supply / tg Initial call taken by: Raechel Ache [facility],  November 20, 2010 2:34 PM    Prescriptions: AMLODIPINE BESYLATE 5 MG TABS (AMLODIPINE BESYLATE) one by mouth daily  #90 x 0   Entered by:   Larita Fife Ashlen Kiger LPN   Authorized by:   Kathlen [place], MD, [facility]   Signed by:   Larita Fife Logun Colavito LPN on 16/07/9603   Method used:   Faxed to ...       [facility] (retail)             , [place]         Ph: [phone]       Fax: [phone]   RxID:   [id number] ALDACTONE 25 MG TABS (SPIRONOLACTONE) one half by mouth daily  #45 x 0   Entered by:   Larita Fife Jaxon Mynhier LPN   Authorized by:   Kathlen [place], MD, [facility]   Signed by:   Larita Fife Aidan Caloca LPN on 32/44/0102   Method used:   Faxed to ...       [facility] (retail)             , [place]         Ph: [phone]       Fax: [phone]   RxID:   [id number] DIGOXIN 0.125 MG TABS (DIGOXIN) on by mouth daily  #90 x 0   Entered by:   Larita Fife Bejamin Hackbart LPN   Authorized by:   Kathlen [place], MD, [facility]   Signed by:   Larita Fife Mertha Clyatt LPN on 04/09/1600   Method used:   Faxed to ...       [facility] (retail)             , [place]         Ph: [phone]       Fax: [phone]   RxID:   [id number] DIOVAN 320 MG TABS (VALSARTAN) one by mouth daily  #90 x 0   Entered by:   Larita Fife Atari Novick LPN   Authorized by:   Kathlen [place], MD, [facility]   Signed by:   Larita Fife Elisse Pennick LPN on 71/03/2693   Method used:   Faxed to ...       [facility] (retail)             , [place]         Ph: [phone]       Fax: [phone]   RxID:   [id number] COREG 25 MG TABS (CARVEDILOL) two by mouth two times a day  #360 x 0   Entered by:   Larita Fife Floraine Buechler LPN   Authorized by:    Kathlen [place], MD, [facility]   Signed by:   Larita Fife Kennedee Kitzmiller LPN on 02/58/5277   Method used:   Faxed to ...       [facility] (retail)             , [place]         Ph: [phone]       Fax: [phone]   RxID:   [id number]

## 2010-12-18 ENCOUNTER — Encounter (INDEPENDENT_AMBULATORY_CARE_PROVIDER_SITE_OTHER): Payer: Self-pay | Admitting: *Deleted

## 2010-12-18 ENCOUNTER — Ambulatory Visit (INDEPENDENT_AMBULATORY_CARE_PROVIDER_SITE_OTHER): Payer: Medicare Other | Admitting: Adult Health

## 2010-12-18 ENCOUNTER — Encounter: Payer: Self-pay | Admitting: Adult Health

## 2010-12-18 DIAGNOSIS — E785 Hyperlipidemia, unspecified: Secondary | ICD-10-CM

## 2010-12-18 DIAGNOSIS — Z95810 Presence of automatic (implantable) cardiac defibrillator: Secondary | ICD-10-CM

## 2010-12-18 DIAGNOSIS — I10 Essential (primary) hypertension: Secondary | ICD-10-CM

## 2010-12-21 LAB — CONVERTED CEMR LAB
ALT: 31 units/L (ref 0–53)
AST: 25 units/L (ref 0–37)
Albumin: 4.9 g/dL (ref 3.5–5.2)
Alkaline Phosphatase: 58 units/L (ref 39–117)
BUN: 21 mg/dL (ref 6–23)
Bilirubin, Direct: 0.1 mg/dL (ref 0.0–0.3)
CO2: 28 meq/L (ref 19–32)
Calcium: 9.9 mg/dL (ref 8.4–10.5)
Chloride: 100 meq/L (ref 96–112)
Cholesterol: 129 mg/dL (ref 0–200)
Creatinine, Ser: 1.05 mg/dL (ref 0.40–1.50)
Glucose, Bld: 148 mg/dL — ABNORMAL HIGH (ref 70–99)
HDL: 33 mg/dL — ABNORMAL LOW (ref >39)
Indirect Bilirubin: 0.5 mg/dL (ref 0.0–0.9)
LDL Cholesterol: 64 mg/dL (ref 0–99)
Potassium: 5.1 meq/L (ref 3.5–5.3)
Sodium: 139 meq/L (ref 135–145)
Total Bilirubin: 0.6 mg/dL (ref 0.3–1.2)
Total CHOL/HDL Ratio: 3.9
Total Protein: 7.9 g/dL (ref 6.0–8.3)
Triglycerides: 161 mg/dL — ABNORMAL HIGH (ref <150)
VLDL: 32 mg/dL (ref 0–40)

## 2010-12-22 NOTE — Letter (Signed)
Summary: [facility] [employer], agricultural at [employer]  [address] [place] [address]   Phone: [phone]  Fax: [phone]     December 18, 2010 MRN: [id number]   [name] [address] [place], [place]  [address]      YOUR LAB WORK IS DUE   December 21, 2010  Please go to [facility], located across the street from [facility] on the second floor.  Hours are Monday - Friday 7am until 7:30pm         Saturday 8am until 12noon    _X_  DO NOT EAT OR DRINK AFTER MIDNIGHT EVENING PRIOR TO LABWORK

## 2010-12-22 NOTE — Assessment & Plan Note (Signed)
Summary: FOLLOW UP - 1 YEAR  Medications Added SIMVASTATIN 20 MG TABS (SIMVASTATIN) Take one tablet by mouth daily at bedtime FUROSEMIDE 20 MG TABS (FUROSEMIDE) one by mouth every 2 days DAILY MULTI  TABS (MULTIPLE VITAMINS-MINERALS) take 1 tab dialy VITAMIN D 400 UNIT CAPS (CHOLECALCIFEROL) take 1 tab daily CALCIUM LACTATE 650 MG TABS (CALCIUM LACTATE) take 1 tab daily      Allergies Added: NKDA  Visit Type:  Follow-up Primary Provider:  Barnet Pall  CC:  1 yr fu no cardiology complaints.  History of Present Illness: [name] is a 70 y/o CM with known history of nonischemic CM and mild incidental CAD, and Atrial fib.  He has an ICD pacemaker that has been checked annually by Dr. Ladona Ridgel.  He has been in essentially good health since being seen last one year ago with the exception of injuring his ribs on the left after falling while removing a fallen tree from his property.  This incapacitated him for several months because of musculoskeltal pain. He again about 20lbs because of inactivity, and has lost 10 of it by the time of this evaluation.  He continues to be medically compliant, he is not a coumadin candidate secondary to GI and Renal bleeding.   He denies any symptoms now and is more active.  No complaints of chest pain, DOE, or edema.  He has had no intercurrent ICD therapies.  Current Medications (verified): 1)  Coreg 25 Mg Tabs (Carvedilol) .... Two By Mouth Two Times A Day 2)  Simvastatin 20 Mg Tabs (Simvastatin) .... Take One Tablet By Mouth Daily At Bedtime 3)  Diovan 320 Mg Tabs (Valsartan) .... One By Mouth Daily 4)  Furosemide 20 Mg Tabs (Furosemide) .... One By Mouth Every 2 Days 5)  Aldactone 25 Mg Tabs (Spironolactone) .... One Half By Mouth Daily 6)  Digoxin 0.125 Mg Tabs (Digoxin) .... On By Mouth Daily 7)  Amlodipine Besylate 5 Mg Tabs (Amlodipine Besylate) .... One By Mouth Daily 8)  Aspirin Ec 325 Mg Tbec (Aspirin) .... One By Mouth Daily 9)   Daily Multi  Tabs (Multiple Vitamins-Minerals) .... Take 1 Tab Dialy 10)  Vitamin D 400 Unit Caps (Cholecalciferol) .... Take 1 Tab Daily 11)  Calcium Lactate 650 Mg Tabs (Calcium Lactate) .... Take 1 Tab Daily  Allergies (verified): No Known Drug Allergies  Comments:  Nurse/Medical Assistant: patient and i reviewed meds no meds no list [facility] [place] [facility]  Past History:  Past medical, surgical, family and social histories (including risk factors) reviewed, and no changes noted (except as noted below).  Past Medical History: Reviewed history from 01/02/2010 and no changes required. ATRIAL FIBRILLATION (ICD-427.31); epistaxis, UGI bleed and renal hematoma on coumadin->partial nephrectomy Non-ischemic Cardiomyopathy: EF-35% in 2005, 40-45% in 2009 ASCVD: non-critical with LM, LAD, RCA <50%; 75% Cx in a small vessel HYPERTENSION (ICD-401.9) AUTOMATIC IMPLANTABLE CARDIAC DEFIBRILLATOR - 2006 Tobacco abuse-30 pk-yrs; D/C in 1995 AODM H/O CHRONIC KIDNEY DISEASE and ARF, but creat. nl in 2009 HYPERLIPIDEMIA (ICD-272.4) Polyps-colon PSORIASIS (ICD-696.1)  Past Surgical History: Reviewed history from 01/02/2010 and no changes required. Nephrectomy-partial defibrillator insertion-2006  Family History: Reviewed history from 03/11/2009 and no changes required. Father: deceased heart dx Mother: deceased htn Siblings:   1sister alive- good health  Social History: Reviewed history from 03/11/2009 and no changes required. Retired  Tobacco Use - Former.   Review of Systems       All other systems have been reviewed and are negative unless stated  above.   Vital Signs:  Patient profile:   70 year old male Weight:      238 pounds BMI:     32.40 Pulse rate:   87 / minute BP sitting:   126 / 78  (left arm)  Vitals Entered By: Dreama Saa, CNA (December 18, 2010 1:28 PM)  Physical Exam  General:  Well developed, well nourished, in no acute distress. Head:   normocephalic and atraumatic Lungs:  Clear bilaterally to auscultation and percussion. Heart:  Non-displaced PMI, chest non-tender; regular rate and rhythm, S1, S2 without murmurs, rubs or gallops. Carotid upstroke normal, no bruit. Normal abdominal aortic size, no bruits. Femorals normal pulses, no bruits. Pedals normal pulses. No edema, no varicosities. Distant heart sounds. Abdomen:  Bowel sounds positive; abdomen soft and non-tender without masses, organomegaly, or hernias noted. No hepatosplenomegaly. Msk:  Back normal, normal gait. Muscle strength and tone normal. Pulses:  pulses normal in all 4 extremities Extremities:  No clubbing or cyanosis. Neurologic:  Alert and oriented x 3. Psych:  Normal affect.   EKG  Procedure date:  12/18/2010  Findings:      Atrial fibrillation with a controlled ventricular response rate of: Incompete Right bundle branch block.     ICD Specifications Following MD:  Lewayne Bunting, MD     ICD Vendor:  Medtronic     ICD Model Number:  7232     ICD Serial Number:  [id number] ICD DOI:  08/24/2005     ICD Implanting MD:  Lewayne Bunting, MD  Lead 1:    Location: RV     DOI: 08/24/2005     Model #: 4034     Serial #: [id number]     Status: active  Indications::  NICM   ICD Follow Up ICD Dependent:  No      Episodes Coumadin:  No  Brady Parameters Mode VVI     Lower Rate Limit:  40      Tachy Zones VF:  200     VT:  250     VT1:  182     Impression & Recommendations:  Problem # 1:  ATRIAL FIBRILLATION (ICD-427.31) Rate is well controlled on this visit.  He is  not a candidate for anticoagulation secondary to renal and GI bleed.  He is without complaint. His updated medication list for this problem includes:    Coreg 25 Mg Tabs (Carvedilol) .Marland Kitchen..Marland Kitchen Two by mouth two times a day    Digoxin 0.125 Mg Tabs (Digoxin) ..... On by mouth daily    Aspirin Ec 325 Mg Tbec (Aspirin) ..... One by mouth daily  Problem # 2:  AUTOMATIC IMPLANTABLE CARDIAC  DEFIBRILLATOR SITU (ICD-V45.02) He has had no discharges from device.  He will follow up with annual ICD checks and phone checks per Dr. Ladona Ridgel.  Problem # 3:  HYPERTENSION (ICD-401.9) Well controlled at presnt.  He will have BMET, Dig level completed as these have not been done in one year.  His updated medication list for this problem includes:    Coreg 25 Mg Tabs (Carvedilol) .Marland Kitchen..Marland Kitchen Two by mouth two times a day    Diovan 320 Mg Tabs (Valsartan) ..... One by mouth daily    Furosemide 20 Mg Tabs (Furosemide) ..... One by mouth every 2 days    Aldactone 25 Mg Tabs (Spironolactone) ..... One half by mouth daily    Amlodipine Besylate 5 Mg Tabs (Amlodipine besylate) ..... One by mouth daily    Aspirin Ec  325 Mg Tbec (Aspirin) ..... One by mouth daily  Future Orders: T-Basic Metabolic Panel [id number]) ... 12/21/2010 T-Lipid Profile [id number]) ... 12/21/2010 T-Hepatic Function [id number]) ... 12/21/2010  Problem # 4:  HYPERLIPIDEMIA (ICD-272.4) Fasting lipids will be completed.  Will follow. His updated medication list for this problem includes:    Simvastatin 20 Mg Tabs (Simvastatin) .Marland Kitchen... Take one tablet by mouth daily at bedtime  Future Orders: T-Basic Metabolic Panel [id number]) ... 12/21/2010 T-Lipid Profile [phone]) ... 12/21/2010 T-Hepatic Function [phone] [phone]) ... 12/21/2010  Patient Instructions: 1)  Your physician recommends that you schedule a follow-up appointment in: 1 year 2)  Your physician recommends that you return for lab work in: next week Prescriptions: AMLODIPINE BESYLATE 5 MG TABS (AMLODIPINE BESYLATE) one by mouth daily  #90 x 3   Entered by:   Teressa Lower RN   Authorized by:   Joni Reining, NP   Signed by:   Teressa Lower RN on 12/18/2010   Method used:   Faxed to ...       [facility] (retail)             , [place]         Ph: [phone]       Fax: [phone]   RxID:   [id number] DIGOXIN 0.125 MG TABS (DIGOXIN) on by  mouth daily  #90 x 3   Entered by:   Teressa Lower RN   Authorized by:   Joni Reining, NP   Signed by:   Teressa Lower RN on 12/18/2010   Method used:   Faxed to ...       [facility] (retail)             , [place]         Ph: [phone]       Fax: [phone]   RxID:   [id number] ALDACTONE 25 MG TABS (SPIRONOLACTONE) one half by mouth daily  #45 x 3   Entered by:   Teressa Lower RN   Authorized by:   Joni Reining, NP   Signed by:   Teressa Lower RN on 12/18/2010   Method used:   Faxed to ...       [facility] (retail)             , [place]         Ph: [phone]       Fax: [phone]   RxID:   [id number] FUROSEMIDE 20 MG TABS (FUROSEMIDE) one by mouth every 2 days  #45 x 3   Entered by:   Teressa Lower RN   Authorized by:   Joni Reining, NP   Signed by:   Teressa Lower RN on 12/18/2010   Method used:   Faxed to ...       [facility] (retail)             , [place]         Ph: [phone]       Fax: [phone]   RxID:   [id number] DIOVAN 320 MG TABS (VALSARTAN) one by mouth daily  #90 x 3   Entered by:   Teressa Lower RN   Authorized by:   Joni Reining, NP   Signed by:   Teressa Lower RN on 12/18/2010   Method used:   Faxed to ...       [facility] (retail)             , [place]         Ph:  [phone]       Fax: [phone]   RxID:   [id number] SIMVASTATIN 20 MG TABS (SIMVASTATIN) Take one tablet by mouth daily at bedtime  #90 x 3   Entered by:   Teressa Lower RN   Authorized by:   Joni Reining, NP   Signed by:   Teressa Lower RN on 12/18/2010   Method used:   Faxed to ...       [facility] (retail)             , [place]         Ph: [phone]       Fax: [phone]   RxID:   [id number] COREG 25 MG TABS (CARVEDILOL) two by mouth two times a day  #180 x 3   Entered by:   Teressa Lower RN   Authorized by:   Joni Reining, NP   Signed by:   Teressa Lower RN on  12/18/2010   Method used:   Faxed to ...       [facility] (retail)             , [place]         Ph: [phone]       Fax: [phone]   RxID:   [id number]

## 2010-12-24 ENCOUNTER — Encounter: Payer: Self-pay | Admitting: Internal Medicine

## 2010-12-24 ENCOUNTER — Encounter (INDEPENDENT_AMBULATORY_CARE_PROVIDER_SITE_OTHER): Payer: Medicare Other

## 2010-12-24 DIAGNOSIS — I428 Other cardiomyopathies: Secondary | ICD-10-CM

## 2011-01-10 ENCOUNTER — Encounter: Payer: Self-pay | Admitting: *Deleted

## 2011-02-23 NOTE — Assessment & Plan Note (Signed)
[facility]                       [name] CARDIOLOGY OFFICE NOTE   [name], [name]                   MRN:          [id number]  DATE:02/20/2007                            DOB:          1941/04/13    IDENTIFICATION:  [name] is a gentleman who I saw back in March.  History of ischemic cardiomyopathy, is status post ICD placement, atrial  fibrillation, dyslipidemia, and hypertension.   When I saw him last, I added aldactone 12.5 to his regimen and increased  Atacand to 32.  Labs checked in one week showed a potassium of 4.9.  Otherwise, the patient says he has been doing okay.  Denies significant  shortness of breath.  No chest pain.   CURRENT MEDICATIONS:  1. Coreg 50 b.i.d.  2. Crestor 10 daily.  3. Digitek 0.125 daily.  4. Atacand 32.  5. Aspirin 325.  6. Lasix 20 every other day.  7. Aldactone 12.5 daily.   PHYSICAL EXAMINATION:  GENERAL:  The patient is in no distress.  VITAL SIGNS:  Blood pressure is 130/80, pulse is 70, on my check 142/86,  weight 215.  NECK:  JVP is normal.  LUNGS:  Clear.  CARDIAC:  Regular rate and rhythm.  S1 S2.  No S3.  No murmurs.  ABDOMEN:  Benign.  EXTREMITIES:  No edema.   IMPRESSION:  1. Coronary artery disease with cardiomyopathy, left ventricular      function of approximately 20-25%. Volume status looks good.  Blood      pressure has been up and down. He says it is sometimes at home 116      systolic.  I would keep him on the current regimen.  With his      potassium and being on an ARB, I am not eager to increase his      aldactone for now.  Again, prescription given for generic Digitek      secondary to recall.  2. Dyslipidemia.  Last lipid panel done in April, LDL was 75, HDL was      35, encouraged him to increase his activity, pull his weight down.   I will set followup for the Fall, sooner if problems develop.    Pricilla Riffle, MD, [facility]    PVR/MedQ  DD: 02/20/2007  DT:  02/20/2007  Job #: [id number]   cc:   Derenda Mis

## 2011-02-23 NOTE — Assessment & Plan Note (Signed)
[facility]                       [place] CARDIOLOGY OFFICE NOTE   [name], [name] [name]                   MRN:          [id number]  DATE:11/20/2007                            DOB:          [birth year]    IDENTIFICATION:  [name] is a 66-old gentleman with a history of  atrial fibrillation, nonischemic cardiomyopathy and hyperlipidemia.  He  was last seen in the Cardiology Clinic back in May 2008.   In the interval, he says he is doing okay.  He claims he is taking all  of his medicines at the right doses.  He denies chest pain, no PND, no  edema.  Appetite is good.  No change in his activity.  He has not been  walking that much.   CURRENT MEDICINES:  1. Coreg 50 b.i.d.  2. Crestor 10.  3. Digitek 0.125 daily.  4. Atacand 32 (complains of price, which was over 100 dollars per      month).  5. Aspirin 325.  6. Lasix 20 every other day.  7. Aldactone 12.5.   PHYSICAL EXAMINATION:  Blood pressure 151/90, pulse 89, weight 225 (up  from 215 in May 2008).  LUNGS:  Clear.  NECK:  No bruit.  No JVD.  CARDIAC EXAMINATION:  Regular rate and rhythm.  S1 and S2.  No definite  S3.  ABDOMEN:  Obese.  EXTREMITIES:  No edema.   IMPRESSION:  1. CARDIOMYOPATHY.  Left ventricular ejection fraction is      approximately 20-25% by echocardiogram.  His volume status looks      good.  I would continue.  2. HYPERTENSION.  Not optimally controlled.  I would add Norvasc to      his regimen at 5 mg, and also have him check on other ARBs such as      Cozaar and Diovan for their copay.  He claims he is taking the      Coreg as directed, 50 twice a day; his heart rate is a little fast.  3. DYSLIPIDEMIA.  Will need fasting lipids.  At this time we will also      check a BMET, AST and TSH.   Otherwise, I will follow up in April 2009 for a blood pressure check.     Pricilla Riffle, MD, [facility]  Electronically Signed    PVR/MedQ  DD: 11/20/2007  DT:  11/21/2007  Job #: [id number]   cc:   Derenda Mis

## 2011-02-23 NOTE — Assessment & Plan Note (Signed)
[facility]                         ELECTROPHYSIOLOGY OFFICE NOTE   NAME:[name], [name]                   MRN:          [id number]  DATE:02/07/2008                            DOB:          1941/08/06    Mr. Crutchfield returns today for followup.  He is a very pleasant 70-year-  old male with a history of nonischemic cardiomyopathy, congestive heart  failure, and atrial fibrillation who is status post ICD insertion.  He  has a history of GI bleeding.  He has atrial fibrillation, and was  initially on Coumadin; but has had to very severe bleeds, one is a  subcapsular hematoma requiring 4 units of blood, and another a severe  bout of epistaxis; and for this reason, now refuses to be on Coumadin.  He returns today for followup.  He is on no intercurrent IC therapies.  He denies chest pain.  He denies shortness of breath.   CURRENT MEDICATIONS INCLUDE:  1. Coreg 50 twice a day.  2. Crestor 10 a day.  3. Atacand 32 mg daily.  4. Aspirin 325 daily.  5. Furosemide 20 daily.  6. Aldactone 12.5 daily.  7. Amlodipine 5 mg daily.  8. Digoxin 0.125 mg daily.   PHYSICAL EXAM:  He is a pleasant well-appearing elderly man in no  distress.  Blood pressure 123/71, the pulse 63 and regular, respirations were 18.  The weight was 219 pounds.  NECK:  Revealed no jugular distention.  There was no thyromegaly.  LUNGS:  Clear bilaterally to auscultation.  No wheezes, rales or rhonchi  are present.  CARDIOVASCULAR EXAM:  Revealed a regular rate and rhythm with a normal  S1 and a split S2.  PMI was enlarged and laterally displaced.  ABDOMINAL EXAM:  Soft and nontender.  There is no organomegaly, but the  bowel sounds are present.  There was no rebound or guarding.  EXTREMITIES:  Demonstrated no cyanosis, clubbing, or edema.  The pulses  were 2+ and symmetric.  NEUROLOGIC EXAM:  Alert and oriented x3 with he cranial nerves intact.  Strength was 5/5 and  symmetric.   Interrogation of his defibrillator demonstrates a Medtronic [place].  The  R waves were 14.  The impedance 536 with a threshold of volt at 0.2.  The battery voltage was 3.13 volts.  Underlying the rhythm was an atrial  fibrillation.  There were no intercurrent ICD therapies.   IMPRESSION:  1. Nonischemic cardiomyopathy.  2. Congestive heart failure.  3. Atrial fibrillation.  4. Status post implantable cardioverter-defibrillator insertion.   DISCUSSION:  Overall Mr. Babe is stable and his defibrillator is  working normally.  We will plan to see the patient back in the office in  several months.     Doylene Canning. Ladona Ridgel, MD  Electronically Signed    GWT/MedQ  DD: 02/07/2008  DT: 02/07/2008  Job #: [id number]

## 2011-02-23 NOTE — Letter (Signed)
July 18, 2008    Rometta Emery, M.D.  Family Practice Physician  [address], [place], [place], [address]   RE:  [name], [name]  MRN:  [id number]  /  DOB:  05-05-1941   Dear Allene Dillon:   [name] returns to the office today for continued assessment and  treatment of a nonischemic cardiomyopathy and chronic atrial  fibrillation.  As you know, he was previously followed by Dr. Tenny Craw, but  now has joined my patient panel.  He has done well in recent months  except for psoriasis, which also has improved under your care.  He was  treated with vitamin D, I presume for vitamin deficiency.  He has  changed his ARB to Diovan from Atacand.  Otherwise, his medications are  unchanged.   He is remarkably active, including cutting wood.  The other day he  loaded 80 cinder blocks on to a truck.  He does note dyspnea with  extreme exertion.  He has no chest discomfort.  There has been no  lightheadedness or syncope.  He has an AICD that has never discharged  and that was assessed in July.   PHYSICAL EXAMINATION:  GENERAL:  Pleasant gentleman in no acute  distress.  VITAL SIGNS:  The weight is 216, 3 pounds less than in April.  Blood  pressure 100/70, heart rate 64 and irregular, respirations 14.  NECK:  No jugular venous distention; normal carotid upstrokes without  bruits.  LUNGS:  Clear.  CARDIAC:  Normal first and second heart sounds; irregular rhythm.  ABDOMEN:  Soft and nontender; no masses; no organomegaly; normal bowel  sounds without bruits.  EXTREMITIES:  No edema; distal pulses intact.   A chemistry profile from June shows and normal electrolytes, a glucose  of 128 and a BUN of 28 with a normal creatinine of 0.89.   IMPRESSION:  [name] is doing very well with respect to control of  cardiomyopathy and congestive heart failure.  In fact, his performance  status is almost incompatible with the severity of his cardiomyopathy as  assessed by echocardiography in 2006.   We will repeat an echocardiogram  to reevaluate left ventricular function.  Otherwise, his medical regime  appears optimal.  He may no longer require amlodipine, but his usual  medications will be continued for the time being.  I discussed the risk  of thromboembolism, which is substantial if he in fact has an ejection  fraction of 20-25%.  He is fearful of anticoagulation, having suffered a  subcapsular renal hematoma requiring partial nephrectomy.  If he  continues to have substantially impaired left ventricular systolic  function, I would be inclined to strongly encouraged him to resume  treatment with warfarin.  I will reassess this nice gentleman in 6  months.    Sincerely,      Gerrit Friends. [name], MD, [facility]  Electronically Signed    RMR/MedQ  DD: 07/18/2008  DT: 07/19/2008  Job #: [id number]

## 2011-02-23 NOTE — Assessment & Plan Note (Signed)
[facility]                       [place] CARDIOLOGY OFFICE NOTE   NAME:[name], [name]                   MRN:          [id number]  DATE:01/18/2008                            DOB:          02-Sep-1941    IDENTIFICATION:  The patient is a 70 year old gentleman with atrial  fibrillation, nonischemic cardiomyopathy, hypertension, dyslipidemia.  I  last saw him in February.   When I saw him last, his blood pressure was high and he was also noting  that his medications cost too much and the co-pay.  Was wondering if  things could be switched.  I went ahead and added Norvasc to his  regimen.   In the interval he has done okay.  He denies chest pressure.  Breathing  is good.   CURRENT MEDICINES:  Include Coreg 50 b.i.d., Crestor 10, Digitek 0.125,  Atacand 32, aspirin 325, furosemide every other day 20, Aldactone 12.5  and amlodipine 5.   PHYSICAL EXAM:  The patient is in no distress.  Blood pressure is 116/72, pulse is 76 and irregular.  Weight 221, down 4  pounds from previous.  Lungs are clear.  No rales.  NECK:  JVP is normal.  CARDIAC EXAM:  Irregularly irregular, S1, S2.  No S3.  ABDOMEN:  No hepatomegaly.  EXTREMITIES:  No edema.   IMPRESSION:  1. Hypertension.  Good control.  Would continue.  He brought in a list      and Diovan would be cheaper than the Atacand.  I have gone ahead      and written for 320.  I would like a BMET in 7 days from starting      this.  Continue other meds.  2. Cardiomyopathy.  Volume status looks good.  Blood pressure      adequate.  3. Hypertension.  He refuses to take Coumadin because of a subcapsular      bleed of his kidney.  Will keep on full-dose aspirin.  We have      discussed in the past.  He understands the risks.  4. Coronary artery disease, nonobstructive.  Aspirin.  5. Dyslipidemia.  On Crestor.  HDL a little low at 33.  Encouraged      exercise.  Would keep on the same.   I will set  followup for the fall, sooner if problems develop.     Pricilla Riffle, MD, [facility]  Electronically Signed    PVR/MedQ  DD: 01/18/2008  DT: 01/18/2008  Job #: [id number]   cc:   Primary Care Physician

## 2011-02-26 NOTE — H&P (Signed)
[name], GAUGHAN NO.:  [id number]   MEDICAL RECORD NO.:  [id number]          PATIENT TYPE:  INP   LOCATION:  0002                         FACILITY:  [facility]   PHYSICIAN:  Jamison Neighbor, M.D.  DATE OF BIRTH:  [birth year]   DATE OF ADMISSION:  06/01/2005  DATE OF DISCHARGE:                                HISTORY & PHYSICAL   SERVICE:  Urology.   ADMISSION DIAGNOSES:  1.  Left renal cell carcinoma.  2.  Recent perirenal hematoma.  3.  Hypertension.  4.  Cardiac arrhythmia.  5.  Borderline diabetes.   HISTORY:  This 70 year old male has been on chronic Coumadin therapy because  of atrial fibrillation. The patient is known to have a cardiomyopathy. He  has had recent cardiac catheterization, ejection fraction was at 34%. The  patient has had attempts at cardioversion. He was on Coumadin when he  developed a spontaneous bleed surrounding the left kidney. He was  transferred from [facility] to [place] for management and required multiple  blood transfusions as well as fresh frozen plasma. The patient had a  hematoma that extended throughout his retroperitoneal space surrounding the  kidney and extending all the way over towards the liver. The patient was  treated conservatively and eventually was able to leave the hospital. He was  not restarted on anticoagulants. His followup CT scan showed the hematoma  had resolved but did suggest that there was a tumor in the left lateral  aspect of the kidney consistent with a renal cell carcinoma. The patient has  been advised that he needs to have this removed. He has agreed to have a  nephrectomy if a partial nephrectomy cannot be performed. He understands the  risks and benefits of the procedure and is to be admitted following the  procedure.   PAST MEDICAL HISTORY:  Remarkable for cardiac disease as well as the  borderline diabetes. He has a history of colonic polyps. He does have a  history of hypertension as  well.   MEDICATIONS ON ADMISSION:  1.  Coreg 25 mg b.i.d.  2.  Norvasc 5 mg daily.  3.  Lasix 20 mg every other day.  4.  Atacand 16 mg daily.  5.  Crestor 10 mg daily.  6.  Digitek 0.125 mg daily.  7.  Aspirin 81 mg daily which has been discontinued.  8.  Colace.   PAST SURGICAL HISTORY:  Negative.   ALLERGIES:  He has no known allergies.   SOCIAL HISTORY:  The patient drinks very modest amounts of alcohol. He has  had no tobacco in over 11 years.   FAMILY HISTORY:  Noncontributory.   REVIEW OF SYMPTOMS:  Noncontributory.   PHYSICAL EXAMINATION:  GENERAL:  The patient is a well-developed, well-  nourished male in no acute distress.  VITAL SIGNS:  Temperature 98.2, pulse 72, respirations 16, blood pressure  160/90.  HEENT:  Normocephalic, atraumatic. Cranial nerves II-XII appeared grossly  intact.  NECK:  Supple with no adenopathy or thyromegaly.  LUNGS:  Clear.  HEART:  Intermittently irregular rhythm with no murmurs, thrills, gallops,  rubs or heaves.  ABDOMEN:  Soft. The patient does not really have a palpable mass on the left  hand side. Previously there had been a clear cut hematoma that was resolved.  EXTREMITIES:  No cyanosis, clubbing or edema.  NEUROLOGIC/VASCULAR:  Intact.  GU:  Shows normal testicles bilaterally. Penis is free of any lesions.  RECTAL:  Not performed but had been done recently and was normal.   IMPRESSION:  Left renal mass.   PLAN:  Admit following left partial nephrectomy.           ______________________________  Jamison Neighbor, M.D.  Electronically Signed     RJE/MEDQ  D:  06/01/2005  T:  06/01/2005  Job:  [id number]   cc:   [facility], Octavio Manns, [facility]

## 2011-02-26 NOTE — Assessment & Plan Note (Signed)
[facility]                         ELECTROPHYSIOLOGY OFFICE NOTE   NAME:COVINGTONTerryn, Redner                   MRN:          [id number]  DATE:01/18/2007                            DOB:          02/13/1941    [name] returns today for followup.  He is a very pleasant 70-year-  old man with known ischemic cardiomyopathy, congestive heart failure,  and left bundle branch block, who is status post single chamber ICD  implantation.  He returns today for followup.  He denies chest pain.  He  does admit to some prior heart failure symptoms but otherwise has been  stable.   PHYSICAL EXAMINATION:  GENERAL:  He is a pleasant man in no acute  distress.  VITAL SIGNS:  Blood pressure today was 60/80, pulse 92 and irregular,  respirations 18.  Weight was 217 pounds, down 2 pounds from his visit  with Dr. Tenny Craw.   MEDICATIONS:  1. Coreg 50 twice daily.  2. Crestor 10 daily.  3. Digoxin 0.125 daily.  4. Atacand 32 mg daily.  5. Aspirin 325 a day.  6. Furosemide 20 mg every other day.   Interrogation of his defibrillator demonstrates a Medtronic Maximo,  implanted in January, 2006.  His R waves are 17.  His impedence is 568  ohm with a threshold volt of 0.3.  Battery voltage was 3.16 volts.   IMPRESSION:  1. Nonischemic cardiomyopathy.  2. Chronic atrial fibrillation with the patient refusing Coumadin      therapy.  3. Congestive heart failure, presently well controlled.  4. Status post implantable cardioverter/defibrillator insertion.   DISCUSSION:  Overall, Mr. Lant is stable, and his defibrillator is  working normally.  He has had bleeding spontaneously on his Coumadin,  and for this reason, continues to refuse Coumadin for thromboembolic  prevention of his A fib.  His defibrillator today is working normally,  and I will plan to see him back in one year for ICD followup.     Doylene Canning. Ladona Ridgel, MD  Electronically Signed    GWT/MedQ  DD:  01/18/2007  DT: 01/18/2007  Job #: [id number]

## 2011-02-26 NOTE — Assessment & Plan Note (Signed)
[facility]                           ELECTROPHYSIOLOGY OFFICE NOTE   NAME:COVINGTONZen, [name]                   MRN:          [id number]  DATE:05/26/2006                            DOB:          05/11/1941    [name] is seen in device clinic today, May 26, 2006, for routine  follow-up of his Medtronic Maximo model 7232 single-chamber defibrillator  implanted in November 2006 for nonischemic cardiomyopathy.  Upon  interrogation there were 3375 nonsustained episodes stored in the device,  which is not unusual for this gentleman.  Upon interrogation, battery  voltage is 3.19 V with last recorded charge time of 7.4 msec.  In the right  ventricle, intrinsic amplitude is 11.4 mV with an impedance of 576 Ohms and  a threshold of 1 V at 0.4 msec.  High voltage lead impedance is 60.  No  programming changes were made at this time.  [name] will be enrolled  in [facility] and send transmissions at 39, 6 and 9 months, see Dr. Ladona Ridgel in  one year's time.                                   Cleatrice Burke, RN                                Doylene Canning. Ladona Ridgel, MD   CF/MedQ  DD:  05/26/2006  DT:  05/26/2006  Job #:  [id number]

## 2011-02-26 NOTE — Discharge Summary (Signed)
NAME:  [name], [name]                      ACCOUNT NO.:  [id number]   MEDICAL RECORD NO.:  [id number]                   PATIENT TYPE:  INP   LOCATION:  2014                                 FACILITY:  [facility]   PHYSICIAN:  Veneda Melter, M.D.                   DATE OF BIRTH:  [birth year]   DATE OF ADMISSION:  01/31/2004  DATE OF DISCHARGE:  02/06/2004                                 DISCHARGE SUMMARY   PROCEDURES:  1. Cardiac catheterization.  2. Coronary arteriogram.  3. Left ventriculogram.   HOSPITAL COURSE:  [name] is a 70 year old male with a history of  atrial fibrillation but no known history of coronary artery disease.  At  12:30 on January 30, 2004, he developed left shoulder and chest pain that was  relieved by taking a nap.  It recurred at approximately 3 p.m.  He had  increased pain and nausea.  He went to the emergency room at [facility] at  approximately 7 p.m.  He was give multiple medications, and the pain was  finally relieved to about midnight.  He was short of breath, as well.  His  enzymes were elevated in [place], and he was transferred to [facility] for further evaluation and treatment.  Because of his non-ST  elevation MI, it was felt that cardiac catheterization was indicated, and  this was performed on January 31, 2004.  The cardiac catheterization showed  nonobstructive coronary artery disease with a 20% left main and multiple  other 20-30% lesions.  In the ramus, there was a 50% and then a 75%  stenosis, but this is a 2 mm vessel.  His EF was 34% with global hypokinesis  and 1% MR.  Dr. Gerri Spore reviewed the films and felt that he had severely  decreased left ventricular ejection fraction due to nonischemic  cardiomyopathy.  He felt that the coronary artery disease at catheterization  was not sufficient to cause the decrease in his EF.  He has single-vessel  coronary artery disease and a small ramus.  There was no clear culprit for  increased  enzymes.  Medical therapy is recommended and Lopressor was changed  to Coreg.  Because of his history of atrial fibrillation, he was kept in the  hospital on heparin until his Coumadin was therapeutic.  Digoxin was added  to his medication regimen.   [name] was referred to cardiac rehab and ambulated with them.  He did  well with this.  His blood pressure was 90 to 100 at times and the Coreg was  slowly increased.  By the time of discharge, he was tolerating this.  His  [name] rate was generally under good control and, even after ambulating 330  feet, his [name] rate was only 120.   By February 06, 2004, his [name] rate was generally between 80 and 100.  His O2  saturation was 96% on room air, and  his hemoglobin was 13.1.  His INR was  1.9 at discharge.  His INR had been 1.3 upon admission and he stated that he  had had no nosebleeds when taking 7.5 mg daily, and this had been cut back  to 5 mg daily.  However, on admission, his INR was only 1.3.  He was given a  dosage of 5 mg alternating with 7.5 mg, 7.5 mg to be taken on February 06, 2004.  A follow-up appointment was made with Dr. Tasia Catchings in [place].  He is  to have his Coumadin monitored by Dr. Tasia Catchings and follow up with cardiology.  In 4-6 weeks, if his Coumadin has remained therapeutic, DCCV can be  considered at that time although the patient states that he had that  previously, and it did not last.  [name] was considered stable for  discharge on February 06, 2004, with outpatient follow-up arranged.   LABORATORY VALUES:  Hemoglobin 13.1, hematocrit 39.2, WBC 7.5, platelets  271, INR 1.9.  Sodium 137, potassium 4.2, chloride 104, CO2 28, BUN 16,  creatinine 0.8, glucose 99.  CK-MB peak 499/61.0, with a troponin I peak of  8.6.  Calcium 8.6, magnesium 2.2.  TSH 1.692, free T4 1.78.  Hemoglobin A1C  6.2.  Total cholesterol 150, triglycerides 117, HDL 26, LDL 101.   Chest x-ray:  Enlarged cardiac silhouette with diffusely prominent  pulmonary  vasculature and interstitial markings with Kerley lines.  Hyperexpanded  lungs with no pleural fluid, mild diffuse osteopenia.  Cardiomegaly and  changes of congestive [name] failure are superimposed on COPD.   DISCHARGE CONDITION:  Improved.   DISCHARGE DIAGNOSES:  1. Non-ST segment elevation myocardial infarction.  2. Status post cardiac catheterization, noncritical coronary artery disease     except for a 75% ramus intermedius that is a 2 mm vessel and medical     therapy is recommended.  3. Nonischemic cardiomyopathy with an ejection fraction of 34% and severe     global hypokinesis with 1+ mitral regurgitation.  4. Remote history of tobacco use with chronic obstructive pulmonary disease     seen on a chest x-ray.  5. Atrial fibrillation.  6. Chronic anticoagulation with Coumadin.  7. Hypertension.  8. Status post colonoscopy with polypectomy.  9. Borderline elevation in hemoglobin A1C at 6.2%.  10.      Dyslipidemia/hyperlipidemia.   DISCHARGE INSTRUCTIONS:  1. His activity level is to include no driving for five days, no strenuous     activity until cleared by physician with exercise per rehab guidelines.  2. He is to stick to a diet that is low in fat and salt.  3. He is to call our office for problems with the catheter site.  4. He is to get a Coumadin checked with Dr. Tasia Catchings on Monday, May 2, at 10     a.m.  5. He is to follow up with Jae Dire, P.A.-C., for Dr. Vida Roller in     the [place] office on Thursday, May 12, at 2:30 p.m.   DISCHARGE MEDICATIONS:  1. Coumadin 5 mg, alternate 1 and 1-1/2 tablets, take 1-1/2 tablets April     28.  2. Aspirin 81 mg daily.  3. Digoxin 0.125 mg daily.  4. Coreg 25 mg b.i.d.  5. Altace 10 mg daily.  6. Crestor 10 mg daily.      Theodore Demark, P.A. [facility]                  Navin  Chales Abrahams, M.D.    RB/MEDQ  D:  02/06/2004  T:  02/07/2004  Job:  [id number]   cc:   Dr. Tim Lair, [place]  Vida Roller, M.D.   Fax: [phone]

## 2011-02-26 NOTE — Discharge Summary (Signed)
[name], [name]            ACCOUNT NO.:  [id number]   MEDICAL RECORD NO.:  [id number]          PATIENT TYPE:  INP   LOCATION:  1404                         FACILITY:  [facility]   PHYSICIAN:  Jamison Neighbor, M.D.  DATE OF BIRTH:  03-15-1941   DATE OF ADMISSION:  03/22/2005  DATE OF DISCHARGE:  03/28/2005                                 DISCHARGE SUMMARY   DISCHARGE DIAGNOSES:  1.  Left renal capsule hematoma.  2.  Atrial fibrillation.  3.  Cardiomyopathy.  4.  Probable renal cell carcinoma.   HOSPITAL COURSE:  This 70 year old male has been on Coumadin therapy because  of atrial fibrillation with a rapid rate.  The patient awoke with left flank  pain and presented to the hospital in [place], and was found to have a left  subcapsular renal hematoma.  He was transferred to [facility] where he was  treated for anemia.  He was hypotensive.  He was resuscitated.  The patient  has had serial CT scans that does show significant hematoma.  The patient  was brought into the hospital for the management of this condition.   Cardiology consultation was sought during the hospital stay to determine if  this patient is going to continue to require Coumadin therapy.  It was felt  that this was causing significant bleeding and that the Coumadin therapy  would need to be discontinued.   During this hospital stay, the patient required Dilaudid, but as his pain  began to decrease and his coagulopathy was reversed with fresh frozen plasm  and potassium, he actually did do better.  At first, he had some nausea, but  then that began to settle down.  He was eventually moved out of the  intensive care unit.  The patient was treated conservatively and he did  receive fresh frozen plasm as well as packed red blood cells and slowly  recovered.  By the time of admission, the patient was felt to be  significantly better.  Our plan was to send him home and allow the hematoma  to resorb.  We will then  evaluate him to determine if there is a source.  The CT scan suggested that there probably was a lesion in that kidney that  kidney that had bled and it is likely that the patient would require a  partial nephrectomy.  He will return in followup to discuss this option.     _______________    RJE/MEDQ  D:  05/11/2005  T:  05/12/2005  Job:  [id number]

## 2011-02-26 NOTE — H&P (Signed)
NAME:  [name], [name]                      ACCOUNT NO.:  [id number]   MEDICAL RECORD NO.:  [id number]                   PATIENT TYPE:  INP   LOCATION:  2301                                 FACILITY:  [facility]   PHYSICIAN:  Veneda Melter, M.D.                   DATE OF BIRTH:  [birth year]   DATE OF ADMISSION:  01/31/2004  DATE OF DISCHARGE:                                HISTORY & PHYSICAL   CHIEF COMPLAINT:  Chest pain.   HISTORY:  The patient is a 70 year old white male with a history of atrial  fibrillation and hypertension who presents for assessment of substernal  chest discomfort and non ST elevation myocardial infarction.  The patient  has had atrial fibrillation for two years and underwent DC cardioversion  approximately one year ago, but has non-sustained normal rhythm.  He has  been rate controlled and anti-coagulated and has been doing well until  yesterday afternoon at approximately 12:30 p.m. when he noted left shoulder  discomfort.  At the time, he was talking on the phone.  He took a nap and  the pain was relieved, but then after waking and moving around at  approximately 3 p.m., he noted recurrence of shoulder and sternal discomfort  that was increasing in severity.  He had associated nausea.  Finally  presented to [facility] emergency room at 7 p.m. where his pain was finally  relieved at approximately midnight.  He had some mild shortness of breath  this morning, but has not had any further chest discomfort overnight.  He  denies any lower extremities edema or claudication.  He denies any bright  red blood per rectum or melena, hematuria or dysuria.  No syncope or  presyncope.  He has not had any recent discomfort at rest or with exertion.  No orthopnea or paroxysmal nocturnal dyspnea.  Review of systems otherwise  noncontributory.   PAST MEDICAL HISTORY:  Past medical history notable for atrial fibrillation  x2 years on chronic Coumadin therapy.  He underwent DC  cardioversion  approximately one year ago.  Hypertension times five years.  History of  colon polypectomies.   ALLERGIES:  None.   MEDICATIONS:  Coumadin, Toprol XL and Diovan.  He does not know dosages.   SOCIAL HISTORY:  The patient has married for 47 years.  Lives in [place].  Retired from [employer].  He has a history of tobacco use, two packs per day  for 30 years.  Quit approximately 10 years ago.  He uses alcohol socially.   FAMILY HISTORY:  Mother died at the age of 11 with mild hypertension.  Father died at the age of 37, he did have heart disease.   PHYSICAL EXAMINATION:  GENERAL:  On examination, the patient is a 63-year-  old white male in no acute distress.  VITAL SIGNS:  Blood pressure is 141/78, pulse 98, respirations 18, O2  saturation is 100% on 2 liters.  HEENT:  Pupils equal, round and reactive to light.  Extraocular muscles are  intact.  Oropharynx  shows no lesions.  NECK:  Supple.  No adenopathy.  No bruits.  HEART:  Exam reveals a regular rate and rhythm without murmurs.  LUNGS:  Clear to auscultation.  ABDOMEN:  Soft and nontender.  EXTREMITIES:  No edema.  Peripheral pulses are 2+ and equal bilaterally.  NEUROLOGICAL:  Motor strength is 5/5.  Sensory is intact to touch.   ECG is atrial fibrillation at 84 with poor R-wave progression.   LABORATORY DATA:  White count of 9.7, hemoglobin 14.3, hematocrit 43.2,  platelets 278, sodium is 137, potassium is 3.4, chloride 99, bicarb 26, BUN  22, creatinine 1, glucose 173.  CK is 652 with MB of 112, troponin I is 5.74  with repeat of 15.08.  BNP is 358.   ASSESSMENT AND PLAN:  The patient is a 70 year old white male with a history  of atrial fibrillation, hypertension, tobacco use and family history of  coronary disease who presents with substernal chest discomfort and abnormal  cardiac enzymes suggestive of non-ST elevation myocardial infarction.  The  patient is currently stable.  Continue anticoagulation.   Initiate beta  blocker therapy as well as statin and check fasting lipid profile.  The  patient will proceed with cardiac catheterization and possible  revascularization.  The risks, benefits and alternatives of which were  discussed with the patient and his wife who understand and wish to proceed.                                                Veneda Melter, M.D.    Melton Alar  D:  01/31/2004  T:  02/02/2004  Job:  [id number]   cc:   Rosalee Kaufman, M.D.  [facility]

## 2011-02-26 NOTE — Discharge Summary (Signed)
[name], [name] NO.:  [id number]   MEDICAL RECORD NO.:  [id number]          PATIENT TYPE:  INP   LOCATION:  6529                         FACILITY:  [facility]   PHYSICIAN:  Doylene Canning. Ladona Ridgel, M.D.  DATE OF BIRTH:  March 27, 1941   DATE OF ADMISSION:  08/24/2005  DATE OF DISCHARGE:  08/25/2005                                 DISCHARGE SUMMARY   PRINCIPAL DIAGNOSES:  1.  Nonischemic cardiomyopathy, ejection fraction 20 to 25%.  2.  Class II congestive heart failure.  3.  Atrial fibrillation, no Coumadin therapy.  4.  Implantation of Medtronic [facility] cardioverter-defibrillator November 14      by Dr. Lewayne Bunting.   SECONDARY DIAGNOSES:  1.  History of renal hematoma and epistaxis on Coumadin.  2.  Hypertension.  3.  Chronic renal insufficiency.  4.  Dyslipidemia.   ALLERGIES:  ALTACE.   PROCEDURES THIS HOSPITALIZATION:  August 24, 2005: Implantation of  Medtronic [facility] cardioverter-defibrillator.  This is a single-channel  device with defibrillator threshold study less than 15 joules, sinus rhythm,  acceptable ventricular pacing.  The patient was paced back to his constant  rhythm which is persistent atrial fibrillation.   BRIEF HISTORY:  Mr. Harewood is a 70 year old male.  He has a nonischemic  cardiomyopathy.  This has been diagnosed about 1-1/2 years ago.  His  ejection fraction on presentation was in the mid 30%, and he presented with  congestive heart failure symptoms.  A catheterization showed no obstructive  coronary artery disease and 1+ mitral regurgitation.  A recent 2-D  echocardiogram showed ejection fraction of 20% with globally dilated left  ventricle and right ventricle.  In the meantime, the patient has had a  subcapsular renal hematoma which has required surgery.  He also has a  history of severe epistaxis, both of these on Coumadin, and the patient is  felt not to be a good Coumadin candidate going forward despite atrial  fibrillation and  dilated cardiomyopathy.  The patient has Class II  congestive heart failure symptoms.  The patient qualifies for implantation  of cardioverter-defibrillator.  The risks and benefits have been discussed  with the patient, and the patient has decided to undergo this procedure.   HOSPITAL COURSE:  The patient presented electively November 14 and underwent  implantation of cardioverter-defibrillator. There were no complications.  Defibrillator threshold study was effected with acceptable result.  The  patient was ready for discharge the following morning.  The patient did have  some elevated blood pressure, but he was missing one of his medications  which was Norvasc.  This will be added to the patient's medication list on  discharge.  The patient is achieving 95% oxygen saturation postprocedure #1.  He is ambulating independently.  He has had no cardiac dysrhythmias other  than atrial fibrillation which has controlled ventricular rate.  He has had  no respiratory distress.   Chest x-ray shows the lead in appropriate position in the right ventricle.  The devise has been interrogated with normal ICD function, no episodes and  no changes.  He has followup at [facility], [address]  [facility] on Thursday, November 30, at 9 o'clock in the morning, and he  will see Dr. Ladona Ridgel November 30, 2005, at 3:50 in the afternoon.  The  patient has no current laboratories.   FOLLOW UP:  [facility], [address] and as mentioned  above.   Duration of this dictation was 20 minutes.      Maple Mirza, P.A.    ______________________________  Doylene Canning. Ladona Ridgel, M.D.    GM/MEDQ  D:  08/25/2005  T:  08/25/2005  Job:  [id number]   cc:   Vida Roller, M.D.  Fax: [phone]   Myra Gianotti, M.D.  [place], [place]

## 2011-02-26 NOTE — Procedures (Signed)
[name], [name] NO.:  [id number]   MEDICAL RECORD NO.:  [id number]          PATIENT TYPE:  OUT   LOCATION:  RAD                           FACILITY:  [facility]   PHYSICIAN:  Vida Roller, M.D.   DATE OF BIRTH:  [birth year]   DATE OF PROCEDURE:  07/28/2005  DATE OF DISCHARGE:                                  ECHOCARDIOGRAM   PRIMARY CARE PHYSICIAN:  Unknown.   DATE OF PROCEDURE:  July 28, 2005   TAPE NUMBER:  [id number]   TAPE COUNT:  [id number]   CLINICAL HISTORY:  This is a 70 year old man with history of coronary  disease and hypertension for LV systolic function.  No previous echoes.  Technical quality is adequate.   M-MODE TRACINGS:  The aorta is 31 mm.   Left atrium is 47 mm.   The septum is 15 mm.   Posterior wall is 14 mm.   Left ventricular diastolic dimension 58 mm.   Left ventricular systolic dimension 44 mm.   TWO-DIMENSIONAL AND DOPPLER IMAGING:  The left ventricle is dilated.  There  is depressed LV systolic function with an estimated ejection fraction of 20-  25%.  There is mild concentric left ventricular hypertrophy and global  hypokinesis.   The right ventricle is dilated with depressed RV systolic function.   Both atria are dilated.   The aortic valve is sclerotic with no stenosis or regurgitation.   The mitral valve has mild regurgitation.   The tricuspid valve has mild regurgitation.   There is no pericardial effusion.   The inferior vena cava appears to be normal size.  Ascending aorta not well  seen.      Vida Roller, M.D.  Electronically Signed     JH/MEDQ  D:  07/28/2005  T:  07/28/2005  Job:  [id number]
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
## 2011-02-26 NOTE — Op Note (Signed)
NAMEARMANDO, [name] NO.:  [id number]   MEDICAL RECORD NO.:  [id number]          PATIENT TYPE:  INP   LOCATION:  0002                         FACILITY:  [facility]   PHYSICIAN:  Jamison Neighbor, M.D.  DATE OF BIRTH:  07-Sep-1941   DATE OF PROCEDURE:  DATE OF DISCHARGE:                                 OPERATIVE REPORT   PREOPERATIVE DIAGNOSIS:  Left renal mass.   POSTOPERATIVE DIAGNOSIS:  Left renal mass, left renal cyst.   PROCEDURE:  1.  Left partial nephrectomy.  2.  Simple cyst unroofing.   SURGEON:  Jamison Neighbor, M.D.   ASSISTANT:  [employer], M.D., Glade Nurse, M.D.   ANESTHESIA:  General.   SPECIMENS:  1.  Renal cyst wall.  2.  Left renal mass.  3.  Left perirenal fat overlying renal mass.   DESCRIPTION OF PROCEDURE:  The patient was identified by his wrist bracelet  and brought to room 10 where he received preoperative antibiotics and was  administered general anesthesia. He was prepped and draped in the usual  sterile fashion. Next, a 30 cm left subcostal incision was made. We  dissected down through the dermis and subcutaneous fat with the Bovie  cautery maintaining excellent hemostasis. We then identified the anterior  sheath, associated muscle body which was divided with Bovie cautery. Next,  the posterior sheath was identified and again divided with Bovie cautery.  Next, a Bookwalter retractor was set. We gently packed away the overlying  small bowel with moist lap sponges in medial fashion. We then identified the  white line of Toldt which was divided sharply with Metzenbaum scissors. The  colon was reflected, the white line was divided from the splenic flexure  down to below the level of the inferior pole of the kidney. The colon was  then reflected in a medial direction. We were then able to palpate the  kidney. Gerota's fascia was divided sharply over the anterior surface and a  2 cm exophytic renal mass was noted over the  anterior surface of the kidney.  Just above this, a simple renal cyst was noted. We next dissected off the  anterior overlying perirenal fat which was sent for specimen. We next  dissected off the anterior wall of the renal cyst which was sent for frozen  section to rule out renal cystic carcinoma. Frozen section came back benign  with inflammatory changes. Next, we scored around the circumference of the  renal mass with Bovie cautery and with a combination of Bovie cautery and  sharp dissection, the mass was removed in its entirety with a margin of  normal renal parenchyma. There were several bleeding points which were  controlled with Bovie cautery as well as 3-0 suture ties. Following this,  the entire bed of the renal mass as well as the inner aspect of the renal  cyst was ablated with the argon beam. Following this, excellent hemostasis  was noted. We then applied 5 mm of FloSeal to the tumor bed. Next, we placed  a Surgicel sponge and the remaining perirenal fat was sewn over the renal  bed  with interrupted 2-0 Vicryl sutures. Next, we inspected our entire  operative field which was noted to be hemostatic. The bowel was inspected  and found to be without any injury. Next, we placed a Blake drain lateral to  the incision. We made a 1 cm transverse skin incision, dissected down  through the dermis with Bovie cautery and through the fascial sheath with a  Vanderbilt hemostat. The Harrison Mons was then passed through the tract on a pass  and it was trimmed and sewn into place with a 3-0 Ethilon suture. Next, we  closed the wound in three layers. We closed the posterior sheath with a  running #1 PDS and closed the anterior sheath again with a #1 running PDS.  This wound was then irrigated. We closed the skin with staples. Dressings  were applied. A drain was placed to JP drainage collecting system. The  patient was reversed from his anesthesia which he tolerated without  complications. Please  note Dr. Marcelyn Bruins was present and participated in  all aspects of this case.     ______________________________  Glade Nurse, MD      Jamison Neighbor, M.D.  Electronically Signed    MT/MEDQ  D:  06/01/2005  T:  06/01/2005  Job:  [id number]

## 2011-02-26 NOTE — Consult Note (Signed)
NAMEDREDEN, RIVERE NO.:  [id number]   MEDICAL RECORD NO.:  [id number]          PATIENT TYPE:  INP   LOCATION:  0155                         FACILITY:  [facility]   PHYSICIAN:  Rollene Rotunda, M.D.   DATE OF BIRTH:  Oct 25, 1940   DATE OF CONSULTATION:  03/25/2005  DATE OF DISCHARGE:                                   CONSULTATION   REFERRING PHYSICIAN:  Dr. Logan Bores.   REASON FOR CONSULTATION:  Evaluate patient with atrial fibrillation, a rapid  rate, and a cardiomyopathy.   HISTORY OF PRESENT ILLNESS:  The patient is a pleasant 70 year old gentleman  followed by Dr. Dorethea Clan. He has a history of a nonischemic cardiomyopathy and  nonobstructive coronary artery disease. He also has paroxysmal atrial  fibrillation. He has been maintained on chronic Coumadin therapy. He awoke  recently with left flank pain. He presented to [facility] where he  was noted to have a left subcapsular renal hematoma. He was transferred to  [facility]. He was anemic. He was hypotensive. He was resuscitated with  volume and blood. He has been followed clinically and with CT scans for his  subcapsular hematoma with leak and blood in the pericolic gutters. He has  been managed with IV diltiazem for his atrial fibrillation with rapid rate.  He has had no chest pain or significant shortness of breath since admission.  We are called to assist with his medications, given his multiple  cardiovascular problems.   Prior to waking with his flank pain, the patient was doing quite well. He  says that at baseline he denies any chest discomfort, shortness of breath,  PND, or orthopnea. He has no neck or arm discomfort. He is active and does  not get any exercise-induced nausea, vomiting, or diaphoresis. He says  rarely he will feel palpitations but has had no presyncope or syncope.   Of note, the patient's INR was 4.0 on admission. He says he has it checked  monthly and it is usually therapeutic. He  did have this reversed with fresh  frozen plasma and vitamin K. Also of note, there was a question of left  kidney lesions, possibly a hemorrhagic tumor. This is being evaluated.   PAST MEDICAL HISTORY:  1.  Atrial fibrillation.  2.  Hypertension.  3.  Colonic polyps.  4.  Nonobstructive coronary disease status post non-ST-elevation MI January 31, 2004 (left main 20% stenosis; LAD tandem 30% lesions; circumflex 50%      ostial, 75% proximal stenosis; right coronary artery 25% after PDA. EF      is 35%).  5.  Renal failure.  6.  Borderline diabetes mellitus.   ALLERGIES:  ACE INHIBITOR caused a cough.   MEDICATIONS:  1.  Digoxin 0.125 mg daily.  2.  Protonix 40 mg q.12h.  3.  Clonidine patch.  4.  Cipro 400 mg q.12h.  5.  Labetalol 10-20 p.r.n.  6.  Diltiazem 60 mg q.6h.   (Home medications:  Coreg, Norvasc 5 mg daily, Atacand 16 mg daily,  Coumadin, Lasix 20 mg daily, digoxin, Crestor.)   SOCIAL  HISTORY:  The patient lives in [place]. He is married. He is  retired but does do a lot of things including woodworking with his son. He  quit smoking 11 years ago. He had a 60 pack-year history. He does drink a  little bit.   REVIEW OF SYSTEMS:  As stated in the HPI. Negative for all other systems.   PHYSICAL EXAMINATION:  GENERAL:  The patient is in no distress.  VITAL SIGNS:  Blood pressure 132/65, heart rate 76 and regular.  HEENT:  Eyes unremarkable. Pupils equal, round, and reactive to light and  fundi not visualized.  NECK:  No jugular venous distention, waveform within normal limits, carotid  upstroke brisk and symmetric, right greater than left carotid bruit, no  thyromegaly.  LYMPHATICS:  No cervical, axillary, inguinal adenopathy.  LUNGS:  Clear to auscultation bilaterally.  BACK:  No costovertebral angle tenderness.  CHEST:  Unremarkable.  HEART:  PMI not displaced or sustained. S1 and S2 within normal limits. No  S3, no murmurs.  ABDOMEN:  Obese, positive  bowel sounds normal in frequency and pitch. No  bruits, rebound, guarding. No midline pulsatile mass, hepatomegaly,  splenomegaly.  SKIN:  No rashes, no nodules.  EXTREMITIES:  Show 2+ pulses throughout. No edema, no cyanosis, no clubbing,  no calf tenderness or swelling.  NEUROLOGIC:  Oriented to person, place, and time. Cranial nerves II-XII  grossly intact. Motor grossly intact.   EKG:  Atrial fibrillation, axis within normal limits, intervals within  normal limits, left ventricular hypertrophy by voltage criteria, ST changes  probably digoxin effect, premature ventricular contractions.   ASSESSMENT AND PLAN:  1.  Atrial fibrillation. The patient obvious cannot take Coumadin now. His      rate has been controlled on IV Cardizem and we will switch to p.o.      Ultimately, I would like to try to have his rate controlled with the      Coreg which a target dose is 50 mg q.12h. For now we will have him on 30      mg q.6h. of Cardizem while we restart the Coreg. Will hold off an ARB      for now as he did have renal insufficiency. However, longterm I think he      should be rechallenged with this unless there is some intrinsic kidney      disease or some evidence of bilateral renal artery obstruction, for      which currently there is no evidence.  2.  Cardiomyopathy is to be managed as above. Right now he seems to be      euvolemic and tolerating volume that he has.  3.  Nonobstructive coronary disease. He will continue with risk reduction.      At some point he can be restarted back on his Crestor, but we have to      find the home dose.  4.  Obesity. We will discuss the need to lose weight, diet, and exercise.  5.  Hypertension. If he can tolerate an ARB in the future would be excellent      to maximize this (he was on Atacand 16 mg; 32 would be the target dose)      as well as maximizing his beta blocker to 50 b.i.d. if tolerated. He      might not need Norvasc  longterm.      JH/MEDQ  D:  03/25/2005  T:  03/25/2005  Job:  [id number]   cc:  Vida Roller, M.D.  Fax: [phone]

## 2011-02-26 NOTE — Discharge Summary (Signed)
[name], [name] NO.:  [id number]   MEDICAL RECORD NO.:  [id number]          PATIENT TYPE:  INP   LOCATION:  1402                         FACILITY:  [facility]   PHYSICIAN:  Jamison Neighbor, M.D.  DATE OF BIRTH:  [birth year]   DATE OF ADMISSION:  06/01/2005  DATE OF DISCHARGE:  06/04/2005                                 DISCHARGE SUMMARY   PROCEDURE:  Partial nephrectomy on August 22.   DISCHARGE DIAGNOSES:  1.  Renal mass.  2.  Atrial fibrillation.  3.  Diabetes.  4.  Hypertension.  5.  Coronary artery disease.   HISTORY OF PRESENT ILLNESS:  This 70 year old male has a history of atrial  fibrillation and was on Coumadin therapy for that purpose.  The patient is  known to have cardiomyopathy.  He had a recent coronary catheterization.  He  has also had attempts at cardioversion.  The patient developed some bleeding  from the left kidney.  He was transferred from [facility] to [facility] for  management and required multiple blood transfusions as well as fresh frozen  plasma.  The patient had hematoma extending throughout the retroperitoneal  space, but this did resolve with time.  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  The patient is now to  undergo excisional biopsy of this lesion and partial nephrectomy if  appropriate.   PAST MEDICAL HISTORY:  1.  Cardiac disease.  2.  Borderline diabetes.   MEDICATIONS:  Coreg, Norvasc, Lasix, Atacand, Crestor, Digitek, aspirin and  Colace.   His physical examination and past history are well delineated in the initial  dictated history and physical.   HOSPITAL COURSE:  The patient was taken to the operating room where he was  explored in the left flank and a solid mass was identified.  This was  completely excised.  The final pathology report showed that this was a  benign lesion, although  it had been completely excised.  The patient's  postoperative course was unremarkable.  He rapidly advanced to a regular  diet.  His Al Pimple drain was drained and he was felt to be ready for  discharge.  He was sent home with Percocet to take as needed for pain and  return to the office for removal of staples.           ______________________________  Jamison Neighbor, M.D.  Electronically Signed    RJE/MEDQ  D:  06/30/2005  T:  07/01/2005  Job:  [id number]

## 2011-02-26 NOTE — Assessment & Plan Note (Signed)
[facility]                       [place] CARDIOLOGY OFFICE NOTE   Ruben [name], Ruben [name]                   MRN:          [id number]  DATE:01/09/2007                            DOB:          May 20, 1941    IDENTIFYING INFORMATION/JUSTIFICATION FOR ADMISSION AND CARE:  Mr.  [name] is a 70 year old gentleman with ischemic cardiomyopathy  (status post ICD placement) atrial fibrillation, dyslipidemia, and  hypertension.   HISTORY OF PRESENT ILLNESS:  He was last seen in cardiology clinic by  Dr. Sharrell Ku on November 28th. At that time, his Norvasc was stopped  and his Coreg increased to improve heart rate control. Prior to that, he  had been seen by Dr. Dionicio Stall in August. Since seen, he said since  December, his blood pressure has been higher. It has been anywhere from  the 130's to the 180's, over 60's to 116. He took Norvasc for a few days  in December to help out but has not any now.   He denies chest pain. He remains active. No PND. No ICD shocks.   CURRENT MEDICATIONS:  Include Coreg 50 b.i.d., Crestor 10 daily, Digitek  0.125 daily, Atacand 16 daily, aspirin 325 daily, Lasix 20 every other  day.   PHYSICAL EXAMINATION:  GENERAL:  The patient is in no distress.  VITAL SIGNS:  Blood pressure 150/80, pulse 86. Weight 219, down from  224.  NECK:  JVP is normal. No bruits.  LUNGS:  Clear. No rales.  CARDIAC:  Regular rate and rhythm. S1 and S2. No S3. No significant  murmurs.  ABDOMEN:  Supple. Non-tender. No hepatomegaly. Mild tenderness, left  side, mid abdomen (chronic per patient.)  EXTREMITIES:  No edema.   IMPRESSION/PLAN:  1. Ischemic coronary artery disease and cardiomyopathy. Volume status      looks good. I would add Aldactone to his regimen, 12.5. Also      increase his Atacand to 32. Followup in 1 month time. Will check      labs in 1 weeks time. Continue other medicines.  2. Dyslipidemia. Will check fasting lipid  panel.  3. Atrial fibrillation. The patient remains on full dose aspirin. Had      a subcapsular bleed in his kidney a year ago, not a Coumadin      candidate. The patient has refused Coumadin, again at risk for      thromboembolic event, which he knows. I will set followup for 1      month, as noted.     Pricilla Riffle, MD, [facility]  Electronically Signed   PVR/MedQ  DD: 01/09/2007  DT: 01/09/2007  Job #: [id number]   cc:   [facility]

## 2011-02-26 NOTE — Assessment & Plan Note (Signed)
[facility]                         [place] CARDIOLOGY OFFICE NOTE   NAME:[name], [name]                   MRN:          [id number]  DATE:05/17/2006                            DOB:          05/26/1941    He does not have a primary care [name] anymore.  He used to go to [facility].  He is currently looking for one.   [name] is a man I follow who has a nonischemic dilated cardiomyopathy  status post catheterization in 2005 with nonobstructive coronary disease,  depressed LV systolic function with global hypokinesis.  Repeat echo shows  an EF of about 20-25%, bi-atrial enlargement and he is status post  defibrillator placement under the care of [name].  He feels  reasonably well.  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   CURRENT MEDICATIONS:  1.  Aspirin 325 once a day.  2.  Digoxin 0.125 once a day.  3.  Crestor 10 mg once a day.  4.  Coreg 25 mg twice a day.  5.  Atacand 16 mg a day.  6.  Lasix 20 mg q.o.d. as needed for lower extremity edema.  7.  Norvasc 5 mg a day.  8.  He takes nitroglycerin on an as needed basis.   HE IS INTOLERANT TO ACE INHIBITORS, HAS A SIGNIFICANT COUGH.   PHYSICAL EXAMINATION:  Today he is 223 pounds, which is reasonably stable  from his previous weights.  His blood pressure is 120/80, pulse is 64.  CHEST:  Clear.  CARDIOVASCULAR EXAM:  Regular.  Point of maximum impulse is displaced  slightly laterally.  No obvious murmur is noted.  LOWER EXTREMITIES:  Without edema.   So, [name] heart failure is actually pretty well controlled.  He is  on a very reasonable medication regimen, his blood pressure and pulse are  well controlled, he has a defibrillator in place, has prophylaxis against  cardiac sudden death and overall is doing well from that standpoint on  pretty much maximal therapy.  Number two is his chronic atrial fibrillation.  He had a subcapsular renal hematoma which required surgery and since that  time has declined further Coumadin therapy, which is why he is on aspirin  325 once a day.  He is obviously at significant risk for a thromboembolic  event and we have talked about it at length, but he feels pretty strongly  that Coumadin caused his renal hematoma and he did not want it again.  With  regard to his primary prophylaxis for coronary disease, he is on Crestor.  He is due lipids reasonably soon.  We recently evaluated him with a basic  metabolic panel which does show slightly elevated glucose but his  cholesterol is well controlled with a total of 109, triglycerides of 87, HDL  of 39, LDL of 53 with normal liver function tests.  So, I think he is  tolerating his Crestor well and  having an excellent therapeutic benefit from that.  We are going to see him  back in six months for routine follow up.                                   Farris Has. Dorethea Clan, MD   JMH/MedQ  DD:  05/17/2006  DT:  05/17/2006  Job #:  [id number]

## 2011-02-26 NOTE — Cardiovascular Report (Signed)
NAME:  [name], [name] NO.:  [id number]   MEDICAL RECORD NO.:  [id number]                   PATIENT TYPE:  INP   LOCATION:  2301                                 FACILITY:  [facility]   PHYSICIAN:  Carole Binning, M.D. [facility]         DATE OF BIRTH:  [birth year]   DATE OF PROCEDURE:  01/31/2004  DATE OF DISCHARGE:                              CARDIAC CATHETERIZATION   PROCEDURES PERFORMED:  1. Left heart catheterization with,  2. Coronary angiography and,  3. Left ventriculography.   CARDIOLOGIST:  Carole Binning, M.D.   INDICATIONS:  [name] is a 70 year old male with a history of chronic  atrial fibrillation.  He recently presented to Dr. Marcelino Freestone office in  [place] after an episode of chest pain.  An echocardiogram showed a  significant decrease in his left ventricular systolic function compared to a  previous echocardiogram.  He was admitted to [facility] where he had  elevated cardiac markers consistent with a non-Q wave myocardial infarction.  He was therefore transferred to [facility] for cardiac  catheterization.   PROCEDURAL NOTE:  A 6 French sheath was placed in the right femoral artery.  Coronary angiography was performed using standard Judkins 6 French  catheters.  Left ventriculography was performed with an angled pigtail  catheter.   CONTRAST MATERIAL:  Contrast used was Omnipaque.   COMPLICATIONS:  There were no complications.   RESULTS:   HEMODYNAMIC DATA:  Left ventricular pressure 132/22.  Aortic pressure  128/85.  There is no aortic valve gradient.   VENTRICULOGRAPHIC DATA:  Left Ventriculogram:  There is severe global  hypokinesis of the left ventricle.  Ejection fraction is calculated at 34%.  There is 1+ mild mitral regurgitation.   ARTERIOGRAPHIC DATA:  Coronary Arteriography  (Right Dominant)   Left Main:  The left main has a 20% stenosis.   Left Anterior Descending Artery:  The left anterior  descending artery has  tandem 30% stenosis in the proximal vessel.  The LAD gives rise to a normal-  sized first diagonal and a small second diagonal branch.   Left Circumflex:  The left circumflex gives rise to a fairly long, but a  relatively small caliber ramus intermedius, a small first obtuse marginal  and a large second obtuse marginal branch.  The ramus intermedius has 50%  stenosis at its ostium and a 75% stenosis in the proximal vessel.   Right Coronary Artery:  The right coronary artery is a large dominant vessel  with moderate diffuse ectasia.  There is a 25% stenosis in the proximal  vessel and diffuse luminal irregularities in the distal vessel.  The distal  right coronary artery gives rise to a normal-sized posterior descending  artery, which has a 40% stenosis in the midvessel.  There is also a normal-  sized first posterolateral branch and small second posterolateral branch.   IMPRESSION:  1. Severe decreased left ventricular systolic function with global  hypokinesis.  This is most consistent with a nonischemic cardiomyopathy.  2. One-vessel coronary artery disease characterized by moderate disease and     a small caliber ramus intermedius.  3. There is no clear culprit for the patient's elevated cardiac markers.   PLAN:  The plan at this point is medical therapy.  The patient will be  restarted on heparin and Coumadin for his atrial fibrillation, and placed on  carvedilol for his LV dysfunction.  The will be continued on his angiotensin  receptor blocker as well.                                               Carole Binning, M.D. [facility]    MWP/MEDQ  D:  01/31/2004  T:  02/02/2004  Job:  [id number]   cc:   Siad Ahmed, M.D.  [place], [place]   Veneda Melter, M.D.   Cardiac Catheterization Laboratory

## 2011-02-26 NOTE — Op Note (Signed)
NAMEPHILO, KURTZ NO.:  [id number]   MEDICAL RECORD NO.:  [id number]          PATIENT TYPE:  INP   LOCATION:  6529                         FACILITY:  [facility]   PHYSICIAN:  Doylene Canning. Ladona Ridgel, M.D.  DATE OF BIRTH:  Feb 17, 1941   DATE OF PROCEDURE:  08/24/2005  DATE OF DISCHARGE:                                 OPERATIVE REPORT   PROCEDURE PERFORMED:  Implantation of a single chamber defibrillator.   INDICATION:  Atrial fibrillation with nonischemic cardiomyopathy, ejection  fraction of 25% with class II heart failure despite maximal medical therapy.   INTRODUCTION:  The patient is a 70 year old male with a history of  longstanding nonischemic cardiomyopathy. He has had severe LV dysfunction  and congestive heart failure for over a year and half. The patient has been  on maximal medical therapy but despite this has had a persistent LV  dysfunction by echo with an EF of 20%. He is now referred for prophylactic  ICD implantation.   PROCEDURE:  After informed consent was obtained, the patient is taken to  diagnostic EP lab in fasting state. After usual preparation and draping,  intravenous fentanyl and Midazolam was given for sedation. 30 mL lidocaine  was infiltrated in the left infraclavicular region. A 7 cm incision was  carried out over this region and electrocautery utilized to dissect down the  fascial plane. The left subclavian vein was punctured and the Medtronic  model 6949 65-cm active fixation defibrillation lead serial number  [id number] was advanced into the right ventricle. Mapping was carried out at  the final site near the RV apex. The R-waves measured 15 mV.  The pacing  impedance with lead actively fixed was 914 ohms. The pacing threshold 0.4  volts of 0.5 milliseconds. 10 volts pacing did not stimulate the diaphragm.  With defibrillation lead in satisfactory position it was secured to  subpectoralis fascia with a figure-of-eight silk suture. In  addition sewing  sleeve was secured with silk suture. Electrocautery utilized to make  subcutaneous pocket. Kanamycin irrigation was used to irrigate the pocket.  Electrocautery utilized to assure hemostasis. The Medtronic single chamber  defibrillator [facility] VR model [id number]) was connected to the defibrillation  lead and placed in the subcutaneous pocket. The device serial number was  [id number]. At this point defibrillation threshold testing was carried out.   After the patient was more deeply sedated with fentanyl and Versed, VF was  induced with T-wave shock. A 15 joules shock was delivered which terminated  both atrial fibrillation and ventricular fibrillation restoring sinus  rhythm. 5 minutes allowed to elapse and second DFT test carried out. This  time, VF was induced with T-wave shock and 10 joules shock was delivered  which failed to terminate VF. A second 25 joules shock was delivered  terminating ventricular fibrillation restoring sinus rhythm. At this point  no additional defibrillation threshold testing was carried out. However,  because the patient had converted back to sinus rhythm with his  defibrillation threshold testing, he was prepped for placement of an atrial  pacing lead to pace the patient back into a-fib.   After  the patient was prepped and draped in usual manner, a 5-French  quadripolar catheter was inserted percutaneously through the right femoral  vein advanced to right atrium. Care was taken not to dislodge the  defibrillator lead. The right atrium was mapped and the pacing was carried  out from the right atrium at 300 milliseconds stepwise decreased down to 200  milliseconds and then discontinued. This resulted in initiation of atrial  fibrillation. At this point no additional testing was carried out. Pressure  was held and hemostasis assured, the patient was returned to his room in  satisfactory condition.   COMPLICATIONS:  There were no procedure  complications.   RESULTS:  This demonstrates successful implantation of a Medtronic single  chamber defibrillator, followed by an acceptable defibrillation threshold  testing. The patient was defibrillated back to sinus rhythm. Because he is  not on Coumadin and not thought to be Coumadin candidate he was subsequently  atrially paced by way of the right femoral venous catheter back into atrial  fibrillation. He is returned to his room in satisfactory condition.   COMPLICATIONS:  There were no immediate procedure complications.   RESULTS:  This demonstrates successful implantation of the Medtronic single  chamber defibrillator in a patient with chronic A fib and nonischemic  cardiomyopathy, severe LV dysfunction, congestive heart failure with an EF  of 20%.           ______________________________  Doylene Canning. Ladona Ridgel, M.D.     GWT/MEDQ  D:  08/24/2005  T:  08/25/2005  Job:  [id number]   cc:   Vida Roller, M.D.  Fax: [phone]

## 2011-02-26 NOTE — H&P (Signed)
[name], [name] NO.:  [id number]   MEDICAL RECORD NO.:  [id number]          PATIENT TYPE:  INP   LOCATION:  1411                         FACILITY[facility]  [facility]   PHYSICIAN:  Jamison Neighbor, M.D.  DATE OF BIRTH:  08-04-1941   DATE OF ADMISSION:  03/22/2005  DATE OF DISCHARGE:                                HISTORY & PHYSICAL   SERVICE:  Urology.   ADMISSION DIAGNOSIS:  Perirenal hematoma.   HISTORY:  This 70 year old male presented to the emergency room with acute  pain on the morning of March 22, 2005. The patient was evaluated in the  emergency room in [place] and was found to have a left subcapsular  hematoma. The patient has been on anticoagulation for atrial fibrillation  and was found to be excessively anticoagulated. He received fresh frozen  plasma, started on Dilaudid PCA due to pain. The patient was transferred to  the [facility] for admission as he requested transfer to the [facility]. The [facility] situation is such that there is not a  urologist available at all times and was not available when the patient  presented to the hospital.   PAST MEDICAL HISTORY:  Remarkable for chronic atrial fibrillation diagnosed  in 2005. He is known to have an ejection fraction of 30%. He has had  cardioversion in the past. He has been on Coumadin at a dose of 7.5 mg every  day except for Wednesday when he takes 10 mg. The patient was found to have  an INR of 4 and for that reason it is felt that his bleeding started. The CT  scan showed a clear cut left subcapsular hematoma. There was no obvious  tumor but clearly there was concern that bleeding would not happen  spontaneously to the kidney if there had been no trauma. The patient denies  any trauma whatsoever.   PAST MEDICAL HISTORY:  The patient's is past medical history is remarkable  for borderline line diabetes. He has a hemoglobin A1c of 6.5. He is not on  any therapy at this time.  He does have a past history of colonic polyps.   PAST SURGICAL HISTORY:  Polypectomy. He has had no other surgery.   MEDICATIONS ON ADMISSION:  Lasix, Atacand, Crestor, digoxin, Norvasc and  Coreg.   SOCIAL HISTORY:  Pertinent for cigarette smoking of two packs a day for 30  years but he said he did stop 11 years ago. The patient uses very modest  amounts of alcohol.   PHYSICAL EXAMINATION:  VITAL SIGNS:  Temperature 97.2, pulse is elevated at  104, respirations 20, blood pressure 93/54.  HEENT:  Normocephalic, atraumatic. Cranial nerves II-XII appear grossly  intact. The patient is slightly diaphoretic.  NECK:  Supple no adenopathy or thyromegaly.  LUNGS:  Clear.  HEART:  Had a somewhat irregular rhythm, no murmurs detected.  ABDOMEN:  Soft but a little distended. There was no hematoma noted on the  left hand side.  GU:  His GU exam is normal.  RECTAL:  Not performed at this time.  SKIN:  A little ashen,  he is sweaty. The patient also has a bruise around  the tip of his nose. There is no bleeding from the nose.   IMPRESSION:  1.  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  2.  Excessive anticoagulation.   PLAN:  Admit to the hospital for observation. The patient will require  repeat CT scan to further assess the extent of the hematoma. He will have  laboratories checked and receive blood transfusions and fresh frozen plasma  as indicated.     _______________    RJE/MEDQ  D:  03/23/2005  T:  03/23/2005  Job:  [id number]

## 2011-03-16 ENCOUNTER — Ambulatory Visit (INDEPENDENT_AMBULATORY_CARE_PROVIDER_SITE_OTHER): Payer: Medicare Other | Admitting: Internal Medicine

## 2011-03-16 ENCOUNTER — Encounter: Payer: Self-pay | Admitting: Internal Medicine

## 2011-03-16 DIAGNOSIS — I428 Other cardiomyopathies: Secondary | ICD-10-CM

## 2011-03-16 DIAGNOSIS — I5022 Chronic systolic (congestive) heart failure: Secondary | ICD-10-CM | POA: Insufficient documentation

## 2011-03-16 DIAGNOSIS — Z95810 Presence of automatic (implantable) cardiac defibrillator: Secondary | ICD-10-CM

## 2011-03-16 NOTE — Assessment & Plan Note (Signed)
His symptoms are class II. He will continue a low-sodium diet, and his current medical therapy. I recommend that he continues to active and walk a regular basis.

## 2011-03-16 NOTE — Progress Notes (Signed)
HPI [name] returns today for followup. He is a pleasant 70 year old man with a nonischemic cardiomyopathy, class II congestive heart failure, dyslipidemia, hypertension, status post ICD implantation. The patient denies chest pain or shortness of breath. He remains active but does note that when he does strenuous activity, he gets short of breath. His heart failure is class II. He denies noncompliance with diet or medications. Not on File   Current Outpatient Prescriptions  Medication Sig Dispense Refill  . amLODipine (NORVASC) 5 MG tablet Take 5 mg by mouth daily.        Marland Kitchen aspirin 325 MG tablet Take 325 mg by mouth daily.        . calcium citrate (CALCITRATE - DOSED IN MG ELEMENTAL CALCIUM) 950 MG tablet Take 1 tablet by mouth daily.        . carvedilol (COREG) 25 MG tablet Take 25 mg by mouth 2 (two) times daily with a meal.        . Cholecalciferol (VITAMIN D) 400 UNITS capsule Take 400 Units by mouth daily.        . digoxin (LANOXIN) 0.125 MG tablet Take 125 mcg by mouth daily.        . furosemide (LASIX) 20 MG tablet Take 20 mg by mouth every other day.       . Multiple Vitamins-Minerals (MULTIVITAMIN WITH MINERALS) tablet Take 1 tablet by mouth daily.        . simvastatin (ZOCOR) 20 MG tablet Take 20 mg by mouth at bedtime.        Marland Kitchen spironolactone (ALDACTONE) 25 MG tablet Take 25 mg by mouth daily. 1/2 tab daily       . valsartan (DIOVAN) 320 MG tablet Take 320 mg by mouth daily.        Marland Kitchen DISCONTD: calcium gluconate 650 MG tablet Take 650 mg by mouth 2 (two) times daily.           Past Medical History  Diagnosis Date  . Atrial fibrillation      epistaxis, UGI bleed and renal hematoma on coumadin->partial nephrectomy  . Non-ischemic cardiomyopathy      EF-35% in 2005, 40-45% in 2009  . ASCVD (arteriosclerotic cardiovascular disease)      non-critical with LM, LAD, RCA <50%; 75% Cx in a small vessel  . Hypertension 2006    AUTOMATIC IMPLANTABLE CARDIAC DEFIBRILLATOR -  .  Tobacco abuse 1995    30 pk-yrs; D/C in 1995  . Diabetes mellitus   . Chronic kidney disease     and ARF, but creat. nl in 2009  . Hyperlipidemia   . Colon polyps   . Psoriasis     ROS:   All systems reviewed and negative except as noted in the HPI.   No past surgical history on file.   No family history on file.   History   Social History  . Marital Status: Married    Spouse Name: N/A    Number of Children: N/A  . Years of Education: N/A   Occupational History  . retired    Social History Main Topics  . Smoking status: Former Games developer  . Smokeless tobacco: Never Used  . Alcohol Use: No  . Drug Use: No  . Sexually Active: Not on file   Other Topics Concern  . Not on file   Social History Narrative  . No narrative on file     BP 114/70  Pulse 78  Ht 5\' 11"  (1.803 m)  Wt  234 lb (106.142 kg)  BMI 32.64 kg/m2  SpO2 95%  Physical Exam:  Well appearing NAD HEENT: Unremarkable Neck:  No JVD, no thyromegally Lymphatics:  No adenopathy Back:  No CVA tenderness Lungs:  Clear. Well-healed ICD incision. HEART:  Regular rate rhythm, no murmurs, no rubs, no clicks Abd:  positive bowel sounds, no organomegally, no rebound, no guarding Ext:  2 plus pulses, no edema, no cyanosis, no clubbing Skin:  No rashes no nodules Neuro:  CN II through XII intact, motor grossly intact  DEVICE  Normal device function.  See PaceArt for details.   Assess/Plan:

## 2011-03-16 NOTE — Patient Instructions (Signed)
Your physician recommends that you schedule a follow-up appointment in 1 YEAR.  

## 2011-03-16 NOTE — Assessment & Plan Note (Signed)
His device is working normally. We'll recheck in several months. 

## 2011-06-17 ENCOUNTER — Other Ambulatory Visit: Payer: Self-pay | Admitting: Internal Medicine

## 2011-06-17 ENCOUNTER — Encounter: Payer: Self-pay | Admitting: Internal Medicine

## 2011-06-17 ENCOUNTER — Ambulatory Visit (INDEPENDENT_AMBULATORY_CARE_PROVIDER_SITE_OTHER): Payer: Medicare Other | Admitting: *Deleted

## 2011-06-17 DIAGNOSIS — I428 Other cardiomyopathies: Secondary | ICD-10-CM

## 2011-06-17 DIAGNOSIS — I4891 Unspecified atrial fibrillation: Secondary | ICD-10-CM

## 2011-06-17 DIAGNOSIS — Z95810 Presence of automatic (implantable) cardiac defibrillator: Secondary | ICD-10-CM

## 2011-06-28 NOTE — Progress Notes (Signed)
icd remote check  

## 2011-07-14 ENCOUNTER — Encounter: Payer: Self-pay | Admitting: *Deleted

## 2011-08-20 ENCOUNTER — Other Ambulatory Visit: Payer: Self-pay

## 2011-08-20 ENCOUNTER — Encounter: Payer: Self-pay | Admitting: Adult Health

## 2011-08-20 ENCOUNTER — Ambulatory Visit (INDEPENDENT_AMBULATORY_CARE_PROVIDER_SITE_OTHER): Payer: Medicare Other | Admitting: Adult Health

## 2011-08-20 VITALS — BP 124/77 | HR 79 | Ht 72.0 in | Wt 237.0 lb

## 2011-08-20 DIAGNOSIS — I10 Essential (primary) hypertension: Secondary | ICD-10-CM

## 2011-08-20 DIAGNOSIS — R079 Chest pain, unspecified: Secondary | ICD-10-CM

## 2011-08-20 DIAGNOSIS — I4891 Unspecified atrial fibrillation: Secondary | ICD-10-CM

## 2011-08-20 DIAGNOSIS — Z95810 Presence of automatic (implantable) cardiac defibrillator: Secondary | ICD-10-CM

## 2011-08-20 DIAGNOSIS — R0789 Other chest pain: Secondary | ICD-10-CM

## 2011-08-20 NOTE — Assessment & Plan Note (Signed)
This appears more musculoskeletal in nature as opposed to cardiac.  The pain is somewhat reproducible with palpation. He is worried that his pacemaker may be causing the discomfort with the wires moving in the endocardium.  This is unlikely but I will have an echo completed, and have his pacemaker interrogated for analysis of changes in impedence.

## 2011-08-20 NOTE — Progress Notes (Signed)
HPI: [name] is a 70 y/o patient of Dr. [name] and Dr.Taylor with known history of nonischemic CM, mild CAD, and Atrial fib.He is not a coumadin candidate because of GI bleeding and renal insufficiency.  He has an ICD pacemaker that has been checked annually by Dr. Ladona Ridgel and recently had it checked in Sept by phone. He comes today complaining of left sided chest pain radiating into the back that has been constant for the last 3 weeks. It is in the general area of where he had a rib fracture one year ago.  He state he doesn't notice it as much during the day because his mind is on other things, but in the evenings when he rests, he notices a soreness and sometimes a sharp feeling going through to the scapular area. This does not stop him from doing his usual work, or keep him awake at night. In fact, today, he states the pain has subsided and feels silly being here now.  No Known Allergies  Current Outpatient Prescriptions  Medication Sig Dispense Refill  . amLODipine (NORVASC) 5 MG tablet Take 5 mg by mouth daily.        Marland Kitchen aspirin 325 MG tablet Take 325 mg by mouth daily.        . calcium citrate (CALCITRATE - DOSED IN MG ELEMENTAL CALCIUM) 950 MG tablet Take 1 tablet by mouth daily.        . carvedilol (COREG) 25 MG tablet Take 25 mg by mouth 2 (two) times daily with a meal.        . cetirizine (ZYRTEC) 10 MG tablet Take 10 mg by mouth as needed.        . Cholecalciferol (VITAMIN D) 400 UNITS capsule Take 400 Units by mouth daily.        . digoxin (LANOXIN) 0.125 MG tablet Take 125 mcg by mouth daily.        . furosemide (LASIX) 20 MG tablet Take 20 mg by mouth every other day.       . metFORMIN (GLUCOPHAGE) 500 MG tablet Take 500 mg by mouth 2 (two) times daily with a meal.        . Multiple Vitamins-Minerals (MULTIVITAMIN WITH MINERALS) tablet Take 1 tablet by mouth daily.        . simvastatin (ZOCOR) 20 MG tablet Take 20 mg by mouth at bedtime.        Marland Kitchen spironolactone (ALDACTONE) 25 MG  tablet Take 25 mg by mouth daily. 1/2 tab daily       . valsartan (DIOVAN) 320 MG tablet Take 320 mg by mouth daily.          Past Medical History  Diagnosis Date  . Atrial fibrillation      epistaxis, UGI bleed and renal hematoma on coumadin->partial nephrectomy  . Non-ischemic cardiomyopathy      EF-35% in 2005, 40-45% in 2009  . ASCVD (arteriosclerotic cardiovascular disease)      non-critical with LM, LAD, RCA <50%; 75% Cx in a small vessel  . Hypertension 2006    AUTOMATIC IMPLANTABLE CARDIAC DEFIBRILLATOR -  . Tobacco abuse 1995    30 pk-yrs; D/C in 1995  . Diabetes mellitus   . Chronic kidney disease     and ARF, but creat. nl in 2009  . Hyperlipidemia   . Colon polyps   . Psoriasis      AVW:UJWJXB of systems complete and found to be negative unless listed above PHYSICAL EXAM BP 124/77  Pulse 79  Ht 6' (1.829 m)  Wt 237 lb (107.502 kg)  BMI 32.14 kg/m2  SpO2 95%  General: Well developed, well nourished, in no acute distress Head: Eyes PERRLA, No xanthomas.   Normal cephalic and atramatic  Lungs: Clear bilaterally to auscultation and percussion. Heart: HRIR S1 S2, without MRG.  Pulses are 2+ & equal.            No carotid bruit. No JVD.  No abdominal bruits. No femoral bruits. Abdomen: Bowel sounds are positive, abdomen soft and non-tender without masses or                  Hernia's noted. Msk:  Back normal, normal gait. Normal strength and tone for age. Extremities: No clubbing, cyanosis or edema.  DP +1 Neuro: Alert and oriented X 3. Psych:  Good affect, responds appropriately   BJY:NWGNFA Fib rate of 82 bpm.  ASSESSMENT AND PLAN

## 2011-08-20 NOTE — Patient Instructions (Signed)
Your physician recommends that you schedule a follow-up appointment in: 1 month  Follow up in device clinic for pacer check  Your physician recommends that you return for lab work in: Today [employer]er, [employer]ellery)  Your physician has requested that you have an echocardiogram. Echocardiography is a painless test that uses sound waves to create images of your heart. It provides your doctor with information about the size and shape of your heart and how well your heart's chambers and valves are working. This procedure takes approximately one hour. There are no restrictions for this procedure.

## 2011-08-20 NOTE — Assessment & Plan Note (Signed)
Rate controlled.  No changes.  Not a coumadin candidate.

## 2011-08-20 NOTE — Assessment & Plan Note (Signed)
He will have appointment next available pacemaker clinic.

## 2011-08-21 LAB — BASIC METABOLIC PANEL
BUN: 21 mg/dL (ref 6–23)
CO2: 25 mEq/L (ref 19–32)
Calcium: 9.5 mg/dL (ref 8.4–10.5)
Chloride: 105 mEq/L (ref 96–112)
Creat: 0.87 mg/dL (ref 0.50–1.35)
Potassium: 4.6 mEq/L (ref 3.5–5.3)
Sodium: 140 mEq/L (ref 135–145)

## 2011-08-23 ENCOUNTER — Encounter: Payer: Self-pay | Admitting: Internal Medicine

## 2011-08-23 ENCOUNTER — Ambulatory Visit (HOSPITAL_COMMUNITY)
Admission: RE | Admit: 2011-08-23 | Discharge: 2011-08-23 | Disposition: A | Discharge status: Home / Self Care | Payer: Medicare Other | Source: Ambulatory Visit | Attending: Family Medicine | Admitting: Family Medicine

## 2011-08-23 ENCOUNTER — Ambulatory Visit (INDEPENDENT_AMBULATORY_CARE_PROVIDER_SITE_OTHER): Payer: Medicare Other | Admitting: *Deleted

## 2011-08-23 DIAGNOSIS — R079 Chest pain, unspecified: Secondary | ICD-10-CM | POA: Insufficient documentation

## 2011-08-23 DIAGNOSIS — I428 Other cardiomyopathies: Secondary | ICD-10-CM

## 2011-08-23 DIAGNOSIS — I10 Essential (primary) hypertension: Secondary | ICD-10-CM | POA: Insufficient documentation

## 2011-08-23 DIAGNOSIS — I517 Cardiomegaly: Secondary | ICD-10-CM

## 2011-08-23 DIAGNOSIS — E785 Hyperlipidemia, unspecified: Secondary | ICD-10-CM | POA: Insufficient documentation

## 2011-08-23 DIAGNOSIS — E119 Type 2 diabetes mellitus without complications: Secondary | ICD-10-CM | POA: Insufficient documentation

## 2011-08-23 NOTE — Progress Notes (Signed)
ICD check 

## 2011-08-23 NOTE — Progress Notes (Signed)
*  PRELIMINARY RESULTS* Echocardiogram 2D Echocardiogram has been performed.  Conrad [place]ville 08/23/2011, 2:44 PM

## 2011-09-14 ENCOUNTER — Encounter: Payer: Self-pay | Admitting: Adult Health

## 2011-09-20 ENCOUNTER — Ambulatory Visit (INDEPENDENT_AMBULATORY_CARE_PROVIDER_SITE_OTHER): Payer: Medicare Other | Admitting: Adult Health

## 2011-09-20 ENCOUNTER — Encounter: Payer: Self-pay | Admitting: Adult Health

## 2011-09-20 DIAGNOSIS — E7800 Pure hypercholesterolemia, unspecified: Secondary | ICD-10-CM

## 2011-09-20 DIAGNOSIS — E785 Hyperlipidemia, unspecified: Secondary | ICD-10-CM

## 2011-09-20 DIAGNOSIS — I10 Essential (primary) hypertension: Secondary | ICD-10-CM

## 2011-09-20 DIAGNOSIS — I4891 Unspecified atrial fibrillation: Secondary | ICD-10-CM

## 2011-09-20 NOTE — Patient Instructions (Signed)
Your physician recommends that you schedule a follow-up appointment in: 8 months  Your physician recommends that you return for lab work in: This week

## 2011-09-20 NOTE — Progress Notes (Signed)
HPI: [name] is a 70 y/o patient of Dr.Rothbart and Dr. Ladona Ridgel we are following for ongoing assessment and treatment of nonischemic CM, mild CAD, atrial fib and ICD in situ. He is not a coumadin candidate secondary to GI bleed. He has had no cardiac complaints. He has recently been placed on metformin for diabetic control, that failed dietary restrictions. He has been feeling good and remains active. He has had no ER visits or hospitalizations.  No Known Allergies  Current Outpatient Prescriptions  Medication Sig Dispense Refill  . Acetaminophen (TYLENOL PO) Take by mouth as needed.        Marland Kitchen amLODipine (NORVASC) 5 MG tablet Take 5 mg by mouth daily.        Marland Kitchen aspirin 325 MG tablet Take 325 mg by mouth daily.        . calcium citrate (CALCITRATE - DOSED IN MG ELEMENTAL CALCIUM) 950 MG tablet Take 1 tablet by mouth daily.        . carvedilol (COREG) 25 MG tablet Take 50 mg by mouth 2 (two) times daily with a meal.       . cetirizine (ZYRTEC) 10 MG tablet Take 10 mg by mouth as needed.        . Cholecalciferol (VITAMIN D) 400 UNITS capsule Take 5,000 Units by mouth daily.       . digoxin (LANOXIN) 0.125 MG tablet Take 125 mcg by mouth daily.        . furosemide (LASIX) 20 MG tablet Take 20 mg by mouth every other day.       . metFORMIN (GLUCOPHAGE) 500 MG tablet Take 500 mg by mouth 2 (two) times daily with a meal.        . Multiple Vitamins-Minerals (MULTIVITAMIN WITH MINERALS) tablet Take 1 tablet by mouth daily.        . simvastatin (ZOCOR) 20 MG tablet Take 20 mg by mouth at bedtime.        Marland Kitchen spironolactone (ALDACTONE) 25 MG tablet Take 25 mg by mouth daily. 1/2 tab daily       . valsartan (DIOVAN) 320 MG tablet Take 320 mg by mouth daily.          Past Medical History  Diagnosis Date  . Atrial fibrillation      epistaxis, UGI bleed and renal hematoma on coumadin->partial nephrectomy  . Non-ischemic cardiomyopathy      EF-35% in 2005, 40-45% in 2009  . ASCVD (arteriosclerotic  cardiovascular disease)      non-critical with LM, LAD, RCA <50%; 75% Cx in a small vessel  . Hypertension 2006    AUTOMATIC IMPLANTABLE CARDIAC DEFIBRILLATOR -  . Tobacco abuse 1995    30 pk-yrs; D/C in 1995  . Diabetes mellitus   . Chronic kidney disease     and ARF, but creat. nl in 2009  . Hyperlipidemia   . Colon polyps   . Psoriasis     Past Surgical History  Procedure Date  . Aicd      placed by Dr. Ladona Ridgel 08/2005.  [employer]tr[employer]  . Partial nephrectomy     WUJ:WJXBJY of systems complete and found to be negative unless listed above PHYSICAL EXAM BP 110/69  Pulse 76  Resp 16  Ht 6' (1.829 m)  Wt 235 lb (106.595 kg)  BMI 31.87 kg/m2  General: Well developed, well nourished, in no acute distress Head: Eyes PERRLA, No xanthomas.   Normal cephalic and atramatic  Lungs: Clear bilaterally to auscultation and percussion.  Heart: Irregular  without MRG.  Pulses are 2+ & equal.            No carotid bruit. No JVD.  No abdominal bruits. No femoral bruits. Abdomen: Bowel sounds are positive, abdomen soft and non-tender without masses or                  Hernia's noted. Msk:  Back normal, normal gait. Normal strength and tone for age. Extremities: No clubbing, cyanosis or edema.  DP +1 Neuro: Alert and oriented X 3. Psych:  Good affect, responds appropriately  ZOX:WRUEAV fib with occasional PVC's.  ASSESSMENT AND PLAN

## 2011-09-20 NOTE — Assessment & Plan Note (Signed)
Excellent control.  I will check a BMET to assess kidney fx on diuretics and spironolactone.

## 2011-09-20 NOTE — Assessment & Plan Note (Signed)
Review of last set of labs done in March of 2012 shows good control of cholesterol but poor control of TG. He is now on a medication regimen of metformin per PCP. The labs will be repeated in one month.

## 2011-09-20 NOTE — Assessment & Plan Note (Signed)
Rate is well controlled and he is without complaint of palpitations.  He is not a coumadin candidate secondary to GIB. He will remain on current medication regimen and call us if he becomes symptomatic.

## 2011-09-23 ENCOUNTER — Encounter: Payer: Medicare Other | Admitting: *Deleted

## 2011-09-25 LAB — HEPATIC FUNCTION PANEL
AST: 20 U/L (ref 0–37)
Albumin: 4.5 g/dL (ref 3.5–5.2)
Alkaline Phosphatase: 52 U/L (ref 39–117)
Indirect Bilirubin: 0.4 mg/dL (ref 0.0–0.9)
Total Bilirubin: 0.5 mg/dL (ref 0.3–1.2)
Total Protein: 7.4 g/dL (ref 6.0–8.3)

## 2011-09-25 LAB — LIPID PANEL
HDL: 31 mg/dL — ABNORMAL LOW (ref >39)
LDL Cholesterol: 72 mg/dL (ref 0–99)
Total CHOL/HDL Ratio: 4.1 Ratio

## 2011-11-25 ENCOUNTER — Encounter: Payer: Medicare Other | Admitting: *Deleted

## 2011-11-29 ENCOUNTER — Encounter: Payer: Self-pay | Admitting: *Deleted

## 2011-12-02 ENCOUNTER — Ambulatory Visit (INDEPENDENT_AMBULATORY_CARE_PROVIDER_SITE_OTHER): Payer: Medicare Other | Admitting: *Deleted

## 2011-12-02 ENCOUNTER — Encounter: Payer: Self-pay | Admitting: Internal Medicine

## 2011-12-02 DIAGNOSIS — Z95810 Presence of automatic (implantable) cardiac defibrillator: Secondary | ICD-10-CM

## 2011-12-02 DIAGNOSIS — I4891 Unspecified atrial fibrillation: Secondary | ICD-10-CM

## 2011-12-02 DIAGNOSIS — I428 Other cardiomyopathies: Secondary | ICD-10-CM

## 2011-12-03 LAB — REMOTE ICD DEVICE
BATTERY VOLTAGE: 2.89 V
RV LEAD AMPLITUDE: 14.1 mv
RV LEAD IMPEDENCE ICD: 600 Ohm
TOT-0006: 20121112000000
TZAT-0004SLOWVT: 8
TZAT-0004SLOWVT: 8
TZAT-0005FASTVT: 88 pct
TZAT-0005SLOWVT: 88 pct
TZAT-0005SLOWVT: 91 pct
TZAT-0011FASTVT: 10 ms
TZAT-0012FASTVT: 200 ms
TZAT-0012SLOWVT: 200 ms
TZAT-0012SLOWVT: 200 ms
TZAT-0013FASTVT: 1
TZAT-0013SLOWVT: 2
TZAT-0013SLOWVT: 2
TZAT-0018FASTVT: NEGATIVE
TZAT-0018SLOWVT: NEGATIVE
TZAT-0019FASTVT: 8 V
TZON-0003FASTVT: 240 ms
TZON-0003SLOWVT: 330 ms
TZON-0004SLOWVT: 40
TZON-0005SLOWVT: 24
TZON-0008FASTVT: 0 ms
TZON-0010SLOWVT: 40 ms
TZST-0001FASTVT: 3
TZST-0001FASTVT: 5
TZST-0001SLOWVT: 4
TZST-0001SLOWVT: 6
TZST-0003FASTVT: 35 J
TZST-0003FASTVT: 35 J
TZST-0003SLOWVT: 25 J
TZST-0003SLOWVT: 35 J
VENTRICULAR PACING ICD: 1 pct

## 2011-12-09 NOTE — Progress Notes (Signed)
Remote icd check  

## 2012-01-10 ENCOUNTER — Encounter: Payer: Self-pay | Admitting: *Deleted

## 2012-02-28 ENCOUNTER — Other Ambulatory Visit: Payer: Self-pay | Admitting: Cardiology

## 2012-02-28 MED ORDER — AMLODIPINE BESYLATE 5 MG PO TABS: 5.0000 mg | ORAL_TABLET | Freq: Every day | ORAL | Status: DC

## 2012-02-28 MED ORDER — FUROSEMIDE 20 MG PO TABS: 20.0000 mg | ORAL_TABLET | ORAL | Status: DC

## 2012-02-28 MED ORDER — SPIRONOLACTONE 25 MG PO TABS: 25.0000 mg | ORAL_TABLET | Freq: Every day | ORAL | Status: DC

## 2012-02-28 MED ORDER — CARVEDILOL 25 MG PO TABS: 50.0000 mg | ORAL_TABLET | Freq: Two times a day (BID) | ORAL | Status: DC

## 2012-02-28 MED ORDER — SIMVASTATIN 20 MG PO TABS: 20.0000 mg | ORAL_TABLET | Freq: Every day | ORAL | Status: DC

## 2012-02-28 MED ORDER — DIGOXIN 125 MCG PO TABS: 125.0000 ug | ORAL_TABLET | Freq: Every day | ORAL | Status: DC

## 2012-02-28 MED ORDER — VALSARTAN 320 MG PO TABS: 320.0000 mg | ORAL_TABLET | Freq: Every day | ORAL | Status: DC

## 2012-02-28 NOTE — Telephone Encounter (Signed)
[facility] MAIL ORDER

## 2012-04-11 ENCOUNTER — Encounter: Payer: Self-pay | Admitting: Internal Medicine

## 2012-04-11 ENCOUNTER — Ambulatory Visit (INDEPENDENT_AMBULATORY_CARE_PROVIDER_SITE_OTHER): Payer: Medicare Other | Admitting: Internal Medicine

## 2012-04-11 VITALS — BP 120/74 | HR 73 | Ht 69.0 in | Wt 235.0 lb

## 2012-04-11 DIAGNOSIS — I5022 Chronic systolic (congestive) heart failure: Secondary | ICD-10-CM

## 2012-04-11 DIAGNOSIS — I10 Essential (primary) hypertension: Secondary | ICD-10-CM

## 2012-04-11 DIAGNOSIS — I4891 Unspecified atrial fibrillation: Secondary | ICD-10-CM

## 2012-04-11 DIAGNOSIS — Z95810 Presence of automatic (implantable) cardiac defibrillator: Secondary | ICD-10-CM

## 2012-04-11 DIAGNOSIS — I428 Other cardiomyopathies: Secondary | ICD-10-CM

## 2012-04-11 LAB — ICD DEVICE OBSERVATION
BATTERY VOLTAGE: 2.79 V
PACEART VT: 0
TZAT-0001FASTVT: 1
TZAT-0001SLOWVT: 1
TZAT-0001SLOWVT: 2
TZAT-0004FASTVT: 8
TZAT-0005SLOWVT: 88 pct
TZAT-0011SLOWVT: 10 ms
TZAT-0011SLOWVT: 10 ms
TZAT-0012FASTVT: 200 ms
TZAT-0019SLOWVT: 8 V
TZAT-0019SLOWVT: 8 V
TZAT-0020FASTVT: 1.6 ms
TZON-0003FASTVT: 240 ms
TZON-0008SLOWVT: 0 ms
TZON-0010FASTVT: 40 ms
TZON-0011AFLUTTER: 70
TZST-0001FASTVT: 2
TZST-0001FASTVT: 4
TZST-0001SLOWVT: 3
TZST-0001SLOWVT: 6
TZST-0003FASTVT: 25 J
TZST-0003FASTVT: 35 J
TZST-0003SLOWVT: 25 J
TZST-0003SLOWVT: 35 J

## 2012-04-11 NOTE — Patient Instructions (Signed)
Your physician recommends that you schedule a follow-up appointment in: 1 year  

## 2012-04-11 NOTE — Progress Notes (Signed)
HPI Mr. Mcelreath returns today for followup. He is a pleasant 71 yo man with an NICM, chronic systolic CHF, s/p ICD implant. He denies chest pain or sob. He remains active. No syncope or ICD shocks. No edema. No Known Allergies   Current Outpatient Prescriptions  Medication Sig Dispense Refill  . Acetaminophen (TYLENOL PO) Take by mouth as needed.        Marland Kitchen amLODipine (NORVASC) 5 MG tablet Take 1 tablet (5 mg total) by mouth daily.  90 tablet  2  . aspirin 325 MG tablet Take 325 mg by mouth daily.        . calcium citrate (CALCITRATE - DOSED IN MG ELEMENTAL CALCIUM) 950 MG tablet Take 1 tablet by mouth daily.        . carvedilol (COREG) 25 MG tablet Take 2 tablets (50 mg total) by mouth 2 (two) times daily with a meal.  360 tablet  2  . cetirizine (ZYRTEC) 10 MG tablet Take 10 mg by mouth as needed.        . Cholecalciferol (VITAMIN D) 400 UNITS capsule Take 5,000 Units by mouth daily.       . digoxin (LANOXIN) 0.125 MG tablet Take 1 tablet (125 mcg total) by mouth daily.  90 tablet  2  . furosemide (LASIX) 20 MG tablet Take 1 tablet (20 mg total) by mouth every other day.  45 tablet  2  . metFORMIN (GLUCOPHAGE) 500 MG tablet Take 500 mg by mouth 2 (two) times daily with a meal.        . Multiple Vitamins-Minerals (MULTIVITAMIN WITH MINERALS) tablet Take 1 tablet by mouth daily.        . simvastatin (ZOCOR) 20 MG tablet Take 1 tablet (20 mg total) by mouth at bedtime.  90 tablet  2  . spironolactone (ALDACTONE) 25 MG tablet Take 1 tablet (25 mg total) by mouth daily. 1/2 tab daily  45 tablet  2  . valsartan (DIOVAN) 320 MG tablet Take 1 tablet (320 mg total) by mouth daily.  90 tablet  2     Past Medical History  Diagnosis Date  . Atrial fibrillation      epistaxis, UGI bleed and renal hematoma on coumadin->partial nephrectomy  . Non-ischemic cardiomyopathy      EF-35% in 2005, 40-45% in 2009  . ASCVD (arteriosclerotic cardiovascular disease)      non-critical with LM, LAD, RCA <50%;  75% Cx in a small vessel  . Hypertension 2006    AUTOMATIC IMPLANTABLE CARDIAC DEFIBRILLATOR -  . Tobacco abuse 1995    30 pk-yrs; D/C in 1995  . Diabetes mellitus   . Chronic kidney disease     and ARF, but creat. nl in 2009  . Hyperlipidemia   . Colon polyps   . Psoriasis     ROS:   All systems reviewed and negative except as noted in the HPI.   Past Surgical History  Procedure Date  . Aicd      placed by Dr. Ladona Ridgel 08/2005.  [employer]tronic  . Partial nephrectomy      No family history on file.   History   Social History  . Marital Status: Married    Spouse Name: N/A    Number of Children: N/A  . Years of Education: N/A   Occupational History  . retired    Social History Main Topics  . Smoking status: Former Smoker    Quit date: 10/11/1993  . Smokeless tobacco: Never Used  .  Alcohol Use: No  . Drug Use: No  . Sexually Active: Not on file   Other Topics Concern  . Not on file   Social History Narrative  . No narrative on file     BP 120/74  Pulse 73  Ht 5\' 9"  (1.753 m)  Wt 235 lb (106.595 kg)  BMI 34.70 kg/m2  SpO2 97%  Physical Exam:  Well appearing NAD HEENT: Unremarkable Neck:  No JVD, no thyromegally Lungs:  Clear with no wheezes, rales, or rhonchi HEART:  Regular rate rhythm, no murmurs, no rubs, no clicks Abd:  soft, positive bowel sounds, no organomegally, no rebound, no guarding Ext:  2 plus pulses, no edema, no cyanosis, no clubbing Skin:  No rashes no nodules Neuro:  CN II through XII intact, motor grossly intact  DEVICE  Normal device function.  See PaceArt for details.   Assess/Plan:

## 2012-04-11 NOTE — Assessment & Plan Note (Signed)
His device is working normally. Will recheck in several months. 

## 2012-04-11 NOTE — Assessment & Plan Note (Signed)
He appears to be maintaining NSR. No change in medical therapy;.

## 2012-04-11 NOTE — Assessment & Plan Note (Signed)
His blood pressure is well controlled. He will continue his current meds and maintain a low sodium diet. 

## 2012-05-24 ENCOUNTER — Encounter: Payer: Self-pay | Admitting: Cardiology

## 2012-05-24 ENCOUNTER — Ambulatory Visit (INDEPENDENT_AMBULATORY_CARE_PROVIDER_SITE_OTHER): Payer: Medicare Other | Admitting: Cardiology

## 2012-05-24 VITALS — BP 110/73 | HR 78 | Ht 69.0 in | Wt 232.0 lb

## 2012-05-24 DIAGNOSIS — I7090 Unspecified atherosclerosis: Secondary | ICD-10-CM

## 2012-05-24 DIAGNOSIS — Z95810 Presence of automatic (implantable) cardiac defibrillator: Secondary | ICD-10-CM

## 2012-05-24 DIAGNOSIS — I10 Essential (primary) hypertension: Secondary | ICD-10-CM

## 2012-05-24 DIAGNOSIS — I4891 Unspecified atrial fibrillation: Secondary | ICD-10-CM

## 2012-05-24 DIAGNOSIS — E785 Hyperlipidemia, unspecified: Secondary | ICD-10-CM

## 2012-05-24 DIAGNOSIS — I2510 Atherosclerotic heart disease of native coronary artery without angina pectoris: Secondary | ICD-10-CM | POA: Insufficient documentation

## 2012-05-24 DIAGNOSIS — L409 Psoriasis, unspecified: Secondary | ICD-10-CM | POA: Insufficient documentation

## 2012-05-24 DIAGNOSIS — I428 Other cardiomyopathies: Secondary | ICD-10-CM | POA: Insufficient documentation

## 2012-05-24 DIAGNOSIS — F17201 Nicotine dependence, unspecified, in remission: Secondary | ICD-10-CM | POA: Insufficient documentation

## 2012-05-24 DIAGNOSIS — E119 Type 2 diabetes mellitus without complications: Secondary | ICD-10-CM | POA: Insufficient documentation

## 2012-05-24 NOTE — Assessment & Plan Note (Signed)
Patient has never required therapeutic discharge of his defibrillator and has shown substantial recovery of left ventricular systolic function.  It may be appropriate to defer replacement of his generator when end-of-life is reached.

## 2012-05-24 NOTE — Progress Notes (Signed)
Patient ID: [name], male   DOB: 06-19-1941, 71 y.o.   MRN: [id number]  HPI: Scheduled return visit for this very nice gentleman who I have not seen for years, but who has been followed by Dr. Lewayne Bunting.  Initially presented with atrial fibrillation and cardiomyopathy out of proportion to moderate coronary artery disease.  An AICD was implanted in 2006 and subsequently discharged inappropriately on one occasion.  Since then, patient has been asymptomatic with an active lifestyle.  He reports one episode of chest discomfort over the past year described as mild to moderate heaviness when he awoke from sleep without associated symptoms.  After a shower, symptoms had resolved.  Prior to Admission medications   Medication Sig Start Date End Date Taking? Authorizing Provider  Acetaminophen (TYLENOL PO) Take by mouth as needed.     Yes Historical Provider, MD  amLODipine (NORVASC) 5 MG tablet Take 1 tablet (5 mg total) by mouth daily. 02/28/12  Yes Jodelle Gross, NP  aspirin 325 MG tablet Take 325 mg by mouth daily.     Yes Historical Provider, MD  calcium citrate (CALCITRATE - DOSED IN MG ELEMENTAL CALCIUM) 950 MG tablet Take 1 tablet by mouth daily.     Yes Historical Provider, MD  carvedilol (COREG) 25 MG tablet Take 2 tablets (50 mg total) by mouth 2 (two) times daily with a meal. 02/28/12  Yes Jodelle Gross, NP  cetirizine (ZYRTEC) 10 MG tablet Take 10 mg by mouth as needed.     Yes Historical Provider, MD  Cholecalciferol (VITAMIN D) 400 UNITS capsule Take 5,000 Units by mouth daily.    Yes Historical Provider, MD  digoxin (LANOXIN) 0.125 MG tablet Take 1 tablet (125 mcg total) by mouth daily. 02/28/12  Yes Jodelle Gross, NP  furosemide (LASIX) 20 MG tablet Take 1 tablet (20 mg total) by mouth every other day. 02/28/12  Yes Jodelle Gross, NP  metFORMIN (GLUCOPHAGE) 500 MG tablet Take 500 mg by mouth 2 (two) times daily with a meal.     Yes Historical Provider, MD  Multiple  Vitamins-Minerals (MULTIVITAMIN WITH MINERALS) tablet Take 1 tablet by mouth daily.     Yes Historical Provider, MD  simvastatin (ZOCOR) 20 MG tablet Take 1 tablet (20 mg total) by mouth at bedtime. 02/28/12  Yes Jodelle Gross, NP  spironolactone (ALDACTONE) 25 MG tablet Take 1 tablet (25 mg total) by mouth daily. 1/2 tab daily 02/28/12  Yes Jodelle Gross, NP  valsartan (DIOVAN) 320 MG tablet Take 1 tablet (320 mg total) by mouth daily. 02/28/12  Yes Jodelle Gross, NP   No Known Allergies    Past medical history, social history, and family history reviewed and updated.  ROS: Denies orthopnea, PND, exertional dyspnea, palpitations, lightheadedness or syncope.  He continues to be resistant to considering treatment with anticoagulant, which was discontinued in 2006 after surgical intervention for a peri-renal hematoma was required.All other systems reviewed and are negative.  PHYSICAL EXAM: BP 110/73  Pulse 78  Ht 5\' 9"  (1.753 m)  Wt 105.235 kg (232 lb)  BMI 34.26 kg/m2  General-Well developed; no acute distress Body habitus-Overweight Neck-No JVD; no carotid bruits Lungs-clear lung fields; resonant to percussion Cardiovascular-normal PMI; normal S1 and S2; irregular rhythm Abdomen-normal bowel sounds; soft and non-tender without masses or organomegaly Musculoskeletal-No deformities, no cyanosis or clubbing Neurologic-Normal cranial nerves; symmetric strength and tone Skin-Warm, no significant lesions Extremities-distal pulses intact; no edema  ASSESSMENT AND PLAN:  [name]  [name], MD 05/24/2012 3:55 PM

## 2012-05-24 NOTE — Assessment & Plan Note (Signed)
Lipid profile was excellent when last assessed less than one year ago.  Current efficacious therapy will be continued.

## 2012-05-24 NOTE — Assessment & Plan Note (Signed)
Blood pressure control has been excellent with current medication, much of which was likely started to treat his cardiomyopathy rather than what appears to be mild hypertension.  No change in therapy is warranted.

## 2012-05-24 NOTE — Patient Instructions (Addendum)
Your physician recommends that you schedule a follow-up appointment in: 1 year  

## 2012-05-24 NOTE — Progress Notes (Deleted)
Name: [name]    DOB: [birth year]  Age: 71 y.o.  MR#: [id number]       PCP:  Exie Parody, MD      Insurance: @PAYORNAME @   CC:    Chief Complaint  Patient presents with  . Appointment    had some chest discomfort once,     VS BP 110/73  Pulse 78  Ht 5\' 9"  (1.753 m)  Wt 232 lb (105.235 kg)  BMI 34.26 kg/m2  Weights Current Weight  05/24/12 232 lb (105.235 kg)  04/11/12 235 lb (106.595 kg)  09/20/11 235 lb (106.595 kg)    Blood Pressure  BP Readings from Last 3 Encounters:  05/24/12 110/73  04/11/12 120/74  09/20/11 110/69     Admit date:  (Not on file) Last encounter with RMR:  02/28/2012   Allergy No Known Allergies  Current Outpatient Prescriptions  Medication Sig Dispense Refill  . Acetaminophen (TYLENOL PO) Take by mouth as needed.        Marland Kitchen amLODipine (NORVASC) 5 MG tablet Take 1 tablet (5 mg total) by mouth daily.  90 tablet  2  . aspirin 325 MG tablet Take 325 mg by mouth daily.        . calcium citrate (CALCITRATE - DOSED IN MG ELEMENTAL CALCIUM) 950 MG tablet Take 1 tablet by mouth daily.        . carvedilol (COREG) 25 MG tablet Take 2 tablets (50 mg total) by mouth 2 (two) times daily with a meal.  360 tablet  2  . cetirizine (ZYRTEC) 10 MG tablet Take 10 mg by mouth as needed.        . Cholecalciferol (VITAMIN D) 400 UNITS capsule Take 5,000 Units by mouth daily.       . digoxin (LANOXIN) 0.125 MG tablet Take 1 tablet (125 mcg total) by mouth daily.  90 tablet  2  . furosemide (LASIX) 20 MG tablet Take 1 tablet (20 mg total) by mouth every other day.  45 tablet  2  . metFORMIN (GLUCOPHAGE) 500 MG tablet Take 500 mg by mouth 2 (two) times daily with a meal.        . Multiple Vitamins-Minerals (MULTIVITAMIN WITH MINERALS) tablet Take 1 tablet by mouth daily.        . simvastatin (ZOCOR) 20 MG tablet Take 1 tablet (20 mg total) by mouth at bedtime.  90 tablet  2  . spironolactone (ALDACTONE) 25 MG tablet Take 1 tablet (25 mg total) by mouth daily.  1/2 tab daily  45 tablet  2  . valsartan (DIOVAN) 320 MG tablet Take 1 tablet (320 mg total) by mouth daily.  90 tablet  2    Discontinued Meds:   There are no discontinued medications.  Patient Active Problem List  Diagnosis  . AUTOMATIC IMPLANTABLE CARDIAC DEFIBRILLATOR SITU  . Atrial fibrillation  . Non-ischemic cardiomyopathy  . Arteriosclerotic cardiovascular disease (ASCVD)  . Hypertension  . Tobacco abuse, in remission  . Hyperlipidemia  . Psoriasis  . Diabetes mellitus type II    LABS Office Visit on 04/11/2012  Component Date Value  . DEVICE MODEL ICD 04/11/2012 [id number]   . DEV-0014ICD 04/11/2012 Lewayne Bunting   M.D.   . Nira Conn 04/11/2012 N   . Sherlon Handing 04/11/2012 Lewayne Bunting   M.D.   . PACEART TECH NOTES ICD 04/11/2012                     Value:ICD check in clinic. Normal  device function. Thresholds and sensing consistent with previous device measurements. Impedance trends stable over time. No evidence of any ventricular arrhythmias. No mode switches. Histogram distribution appropriate for                          patient and level of activity. No changes made this session. Device programmed at appropriate safety margins. Device programmed to optimize intrinsic conduction.   Pt enrolled in remote follow-up. Plan to check device every 3 months remotely and in                          office annually. Patient education completed including shock plan. Alert tones/vibration demonstrated for patient.  Patient with [phone] lead on manufacturer recall. LIA installed on device. Impedance trends stable, SIC counter 0. NSVT episodes reviewed.                           No noise detected, no increase in NSVT episodes.  Patient education completed about alert tones and magnet use. Alert tones demonstrated for patient. Impedance alerts programmed per manufacturer recommendations. Patient not device dependent.  Carelink                          07/17/12.  Marland Kitchen CHARGE TIME 04/11/2012  9.80   . RV LEAD IMPEDENCE ICD 04/11/2012 576   . HV IMPEDENCE 04/11/2012 53/64   . BATTERY VOLTAGE 04/11/2012 2.79   . VF 04/11/2012 0   . FVT 04/11/2012 0   . PACEART VT 04/11/2012 0   . VENTRICULAR PACING ICD 04/11/2012 1   . RV LEAD AMPLITUDE 04/11/2012 12.5   . RV LEAD THRESHOLD 04/11/2012 1   . BRDY-0001RV 04/11/2012 VVI   . BRDY-0002RV 04/11/2012 40   . BRDY-0005RV 04/11/2012 Off   . BRDY-0009RV 04/11/2012 No   . TACH-0002RV 04/11/2012 N/A   . TACH-0003RV 04/11/2012 Enabled   . TZON-0002FASTVT 04/11/2012 ATP + 5 Shocks   . TZON-0003FASTVT 04/11/2012 240   . TZON-0004FASTVT 04/11/2012 18/24   . TZON-0005FASTVT 04/11/2012 30/40   . TZON-0008FASTVT 04/11/2012 0 min   . TZON-0010FASTVT 04/11/2012 40   . TZAT-0001FASTVT 04/11/2012 1   . TZAT-0002FASTVT 04/11/2012 Y   . TZAT-0003FASTVT 04/11/2012 Burst   . TZAT-0004FASTVT 04/11/2012 8   . TZAT-0005FASTVT 04/11/2012 88   . TZAT-0011FASTVT 04/11/2012 10   . TZAT-0012FASTVT 04/11/2012 200   . TZAT-0013FASTVT 04/11/2012 1   . TZAT-0018FASTVT 04/11/2012 N   . TZAT-0019FASTVT 04/11/2012 8   . TZAT-0020FASTVT 04/11/2012 1.6   . TZST-0001FASTVT 04/11/2012 2   . TZST-0002FASTVT 04/11/2012 Y   . TZST-0003FASTVT 04/11/2012 25   . TZST-0004FASTVT 04/11/2012 Biphasic   . TZST-0007FASTVT 04/11/2012 B>AX   . TZST-0001FASTVT 04/11/2012 3   . TZST-0002FASTVT 04/11/2012 Y   . TZST-0003FASTVT 04/11/2012 35   . TZST-0004FASTVT 04/11/2012 Biphasic   . TZST-0007FASTVT 04/11/2012 B>AX   . TZST-0001FASTVT 04/11/2012 4   . TZST-0002FASTVT 04/11/2012 Y   . TZST-0003FASTVT 04/11/2012 35   . TZST-0004FASTVT 04/11/2012 Biphasic   . TZST-0007FASTVT 04/11/2012 B>AX   . TZST-0001FASTVT 04/11/2012 5   . TZST-0002FASTVT 04/11/2012 Y   . TZST-0003FASTVT 04/11/2012 35   . TZST-0004FASTVT 04/11/2012 Biphasic   . TZST-0007FASTVT 04/11/2012 AX>B   . TZST-0001FASTVT 04/11/2012 6   . TZST-0002FASTVT 04/11/2012 Y   . TZST-0003FASTVT 04/11/2012 35   .  TZST-0004FASTVT 04/11/2012 Biphasic   .  TZST-0007FASTVT 04/11/2012 B>AX   . TZON-0002SLOWVT 04/11/2012 ATP + 4 Shocks   . TZON-0003SLOWVT 04/11/2012 330   . TZON-0004SLOWVT 04/11/2012 40   . TZON-0005SLOWVT 04/11/2012 24   . TZON-0008SLOWVT 04/11/2012 0 min   . TZON-0010SLOWVT 04/11/2012 40   . TZAT-0001SLOWVT 04/11/2012 1   . TZAT-0002SLOWVT 04/11/2012 Y   . TZAT-0003SLOWVT 04/11/2012 Burst   . TZAT-0004SLOWVT 04/11/2012 8   . TZAT-0005SLOWVT 04/11/2012 88   . TZAT-0011SLOWVT 04/11/2012 10   . TZAT-0012SLOWVT 04/11/2012 200   . TZAT-0013SLOWVT 04/11/2012 2   . TZAT-0018SLOWVT 04/11/2012 N   . TZAT-0019SLOWVT 04/11/2012 8   . TZAT-0020SLOWVT 04/11/2012 1.6   . TZAT-0001SLOWVT 04/11/2012 2   . TZAT-0002SLOWVT 04/11/2012 Y   . TZAT-0003SLOWVT 04/11/2012 Ramp   . TZAT-0004SLOWVT 04/11/2012 8   . TZAT-0005SLOWVT 04/11/2012 91   . TZAT-0011SLOWVT 04/11/2012 10   . TZAT-0012SLOWVT 04/11/2012 200   . TZAT-0013SLOWVT 04/11/2012 2   . TZAT-0018SLOWVT 04/11/2012 N   . TZAT-0019SLOWVT 04/11/2012 8   . TZAT-0020SLOWVT 04/11/2012 1.6   . TZST-0001SLOWVT 04/11/2012 3   . TZST-0002SLOWVT 04/11/2012 Y   . TZST-0003SLOWVT 04/11/2012 15   . TZST-0004SLOWVT 04/11/2012 Biphasic   . TZST-0007SLOWVT 04/11/2012 B>AX   . TZST-0001SLOWVT 04/11/2012 4   . TZST-0002SLOWVT 04/11/2012 Y   . TZST-0003SLOWVT 04/11/2012 25   . TZST-0004SLOWVT 04/11/2012 Biphasic   . TZST-0007SLOWVT 04/11/2012 B>AX   . TZST-0001SLOWVT 04/11/2012 5   . TZST-0002SLOWVT 04/11/2012 Y   . TZST-0003SLOWVT 04/11/2012 35   . TZST-0004SLOWVT 04/11/2012 Biphasic   . TZST-0007SLOWVT 04/11/2012 AX>B   . TZST-0001SLOWVT 04/11/2012 6   . TZST-0002SLOWVT 04/11/2012 Y   . TZST-0003SLOWVT 04/11/2012 35   . TZST-0004SLOWVT 04/11/2012 Biphasic   . TZST-0007SLOWVT 04/11/2012 B>AX   . TZON-0002VSLOWVT 04/11/2012 N/A   . TZON-0010VSLOWVT 04/11/2012 40   . TZON-0002AFIB 04/11/2012 N/A   . TZON-0002AFLUTTER 04/11/2012 N/A   .  TZON-0010AFLUTTER 04/11/2012 40   . TZON-0011AFLUTTER 04/11/2012 70%   . [facility] 04/11/2012 N/A      Results for this Opt Visit:     Results for orders placed in visit on 04/11/12  ICD DEVICE OBSERVATION      Component Value Range   DEVICE MODEL ICD [id number] H     DEV-0014ICD Lewayne Bunting   M.D.     DEV-0020ICD N     DEV-0014LDO Lewayne Bunting   M.D.     [facility] NOTES ICD       Value: ICD check in clinic. Normal device function. Thresholds and sensing consistent with previous device measurements. Impedance trends stable over time. No evidence of any ventricular arrhythmias. No mode switches. Histogram distribution appropriate for      patient and level of activity. No changes made this session. Device programmed at appropriate safety margins. Device programmed to optimize intrinsic conduction.   Pt enrolled in remote follow-up. Plan to check device every 3 months remotely and in      office annually. Patient education completed including shock plan. Alert tones/vibration demonstrated for patient.  Patient with [phone] lead on manufacturer recall. LIA installed on device. Impedance trends stable, SIC counter 0. NSVT episodes reviewed.       No noise detected, no increase in NSVT episodes.  Patient education completed about alert tones and magnet use. Alert tones demonstrated for patient. Impedance alerts programmed per manufacturer recommendations. Patient not device dependent.  Carelink      07/17/12.   CHARGE TIME 9.80     RV LEAD IMPEDENCE ICD 576  HV IMPEDENCE 53/64     BATTERY VOLTAGE 2.79     VF 0     FVT 0     PACEART VT 0     VENTRICULAR PACING ICD 1     RV LEAD AMPLITUDE 12.5     RV LEAD THRESHOLD 1     BRDY-0001RV VVI     BRDY-0002RV 40     BRDY-0005RV Off     BRDY-0009RV No     TACH-0002RV N/A     TACH-0003RV Enabled     TZON-0002FASTVT ATP + 5 Shocks     TZON-0003FASTVT 240     TZON-0004FASTVT 18/24     TZON-0005FASTVT 30/40     TZON-0008FASTVT 0 min      TZON-0010FASTVT 40     TZAT-0001FASTVT 1     TZAT-0002FASTVT Y     TZAT-0003FASTVT Burst     TZAT-0004FASTVT 8     TZAT-0005FASTVT 88     TZAT-0011FASTVT 10     TZAT-0012FASTVT 200     TZAT-0013FASTVT 1     TZAT-0018FASTVT N     TZAT-0019FASTVT 8     TZAT-0020FASTVT 1.6     TZST-0001FASTVT 2     TZST-0002FASTVT Y     TZST-0003FASTVT 25     TZST-0004FASTVT Biphasic     TZST-0007FASTVT B>AX     TZST-0001FASTVT 3     TZST-0002FASTVT Y     TZST-0003FASTVT 35     TZST-0004FASTVT Biphasic     TZST-0007FASTVT B>AX     TZST-0001FASTVT 4     TZST-0002FASTVT Y     TZST-0003FASTVT 35     TZST-0004FASTVT Biphasic     TZST-0007FASTVT B>AX     TZST-0001FASTVT 5     TZST-0002FASTVT Y     TZST-0003FASTVT 35     TZST-0004FASTVT Biphasic     TZST-0007FASTVT AX>B     TZST-0001FASTVT 6     TZST-0002FASTVT Y     TZST-0003FASTVT 35     TZST-0004FASTVT Biphasic     TZST-0007FASTVT B>AX     TZON-0002SLOWVT ATP + 4 Shocks     TZON-0003SLOWVT 330     TZON-0004SLOWVT 40     TZON-0005SLOWVT 24     TZON-0008SLOWVT 0 min     TZON-0010SLOWVT 40     TZAT-0001SLOWVT 1     TZAT-0002SLOWVT Y     TZAT-0003SLOWVT Burst     TZAT-0004SLOWVT 8     TZAT-0005SLOWVT 88     TZAT-0011SLOWVT 10     TZAT-0012SLOWVT 200     TZAT-0013SLOWVT 2     TZAT-0018SLOWVT N     TZAT-0019SLOWVT 8     TZAT-0020SLOWVT 1.6     TZAT-0001SLOWVT 2     TZAT-0002SLOWVT Y     TZAT-0003SLOWVT Ramp     TZAT-0004SLOWVT 8     TZAT-0005SLOWVT 91     TZAT-0011SLOWVT 10     TZAT-0012SLOWVT 200     TZAT-0013SLOWVT 2     TZAT-0018SLOWVT N     TZAT-0019SLOWVT 8     TZAT-0020SLOWVT 1.6     TZST-0001SLOWVT 3     TZST-0002SLOWVT Y     TZST-0003SLOWVT 15     TZST-0004SLOWVT Biphasic     TZST-0007SLOWVT B>AX     TZST-0001SLOWVT 4     TZST-0002SLOWVT Y     TZST-0003SLOWVT 25     TZST-0004SLOWVT Biphasic     TZST-0007SLOWVT B>AX     TZST-0001SLOWVT 5     TZST-0002SLOWVT Y     TZST-0003SLOWVT 35  TZST-0004SLOWVT Biphasic      TZST-0007SLOWVT AX>B     TZST-0001SLOWVT 6     TZST-0002SLOWVT Y     TZST-0003SLOWVT 35     TZST-0004SLOWVT Biphasic     TZST-0007SLOWVT B>AX     TZON-0002VSLOWVT N/A     TZON-0010VSLOWVT 40     TZON-0002AFIB N/A     TZON-0002AFLUTTER N/A     TZON-0010AFLUTTER 40     TZON-0011AFLUTTER 70%     TZON-0002ATACH N/A      EKG Orders placed in visit on 04/11/12  . EKG 12-LEAD     Prior Assessment and Plan Problem List as of 05/24/2012            Cardiology Problems   Atrial fibrillation   Non-ischemic cardiomyopathy   Arteriosclerotic cardiovascular disease (ASCVD)   Hypertension   Hyperlipidemia     Other   AUTOMATIC IMPLANTABLE CARDIAC DEFIBRILLATOR SITU   Last Assessment & Plan Note   04/11/2012 Office Visit Signed 04/11/2012 10:43 AM by Marinus Maw, MD    His device is working normally Will recheck in several months.    Tobacco abuse, in remission   Psoriasis   Diabetes mellitus type II       Imaging: No results found.   FRS Calculation: Score not calculated. Missing: Total Cholesterol

## 2012-05-24 NOTE — Assessment & Plan Note (Signed)
The patient has never had a known events related to coronary artery disease and has no symptoms now highly suggestive for same.  We will continue to approach moderate CAD by optimal control of cardiovascular risk factors.

## 2012-05-24 NOTE — Assessment & Plan Note (Signed)
Heart rate is well controlled, and patient appears to be asymptomatic with respect to his arrhythmia.  I reviewed with him the fact that he is at significant risk for a thromboembolic event, particularly a CVA, and that alternative anticoagulants are now available.  He continues to be highly resistant to considering treatment with any of those medications.  He has been free of thromboembolism for the past 10 years.  Hopefully, this will continue.

## 2012-07-17 ENCOUNTER — Encounter: Payer: Medicare Other | Admitting: *Deleted

## 2012-07-26 ENCOUNTER — Encounter: Payer: Self-pay | Admitting: *Deleted

## 2012-08-02 ENCOUNTER — Encounter: Payer: Self-pay | Admitting: Internal Medicine

## 2012-08-02 ENCOUNTER — Ambulatory Visit (INDEPENDENT_AMBULATORY_CARE_PROVIDER_SITE_OTHER): Payer: Medicare Other | Admitting: *Deleted

## 2012-08-02 DIAGNOSIS — I428 Other cardiomyopathies: Secondary | ICD-10-CM

## 2012-08-02 DIAGNOSIS — Z95810 Presence of automatic (implantable) cardiac defibrillator: Secondary | ICD-10-CM

## 2012-08-03 LAB — REMOTE ICD DEVICE
BATTERY VOLTAGE: 2.72 V
RV LEAD AMPLITUDE: 13 mv
TZAT-0004FASTVT: 8
TZAT-0005SLOWVT: 88 pct
TZAT-0005SLOWVT: 91 pct
TZAT-0011SLOWVT: 10 ms
TZAT-0011SLOWVT: 10 ms
TZAT-0012FASTVT: 200 ms
TZAT-0012SLOWVT: 200 ms
TZAT-0012SLOWVT: 200 ms
TZAT-0013FASTVT: 1
TZAT-0018SLOWVT: NEGATIVE
TZAT-0018SLOWVT: NEGATIVE
TZAT-0019SLOWVT: 8 V
TZAT-0019SLOWVT: 8 V
TZAT-0020FASTVT: 1.6 ms
TZON-0003FASTVT: 240 ms
TZON-0003SLOWVT: 330 ms
TZON-0004SLOWVT: 40
TZON-0005SLOWVT: 24
TZON-0008SLOWVT: 0 ms
TZON-0010AFLUTTER: 40 ms
TZON-0010FASTVT: 40 ms
TZON-0011AFLUTTER: 70
TZST-0001FASTVT: 3
TZST-0001FASTVT: 4
TZST-0001FASTVT: 5
TZST-0001SLOWVT: 4
TZST-0001SLOWVT: 6
TZST-0003FASTVT: 35 J
TZST-0003FASTVT: 35 J
TZST-0003SLOWVT: 15 J
TZST-0003SLOWVT: 35 J
TZST-0003SLOWVT: 35 J
VENTRICULAR PACING ICD: 1 pct

## 2012-08-10 ENCOUNTER — Encounter: Payer: Self-pay | Admitting: *Deleted

## 2012-11-06 ENCOUNTER — Ambulatory Visit (INDEPENDENT_AMBULATORY_CARE_PROVIDER_SITE_OTHER): Payer: Medicare Other | Admitting: *Deleted

## 2012-11-06 DIAGNOSIS — Z95810 Presence of automatic (implantable) cardiac defibrillator: Secondary | ICD-10-CM

## 2012-11-06 DIAGNOSIS — I428 Other cardiomyopathies: Secondary | ICD-10-CM

## 2012-11-09 ENCOUNTER — Encounter: Payer: Self-pay | Admitting: Internal Medicine

## 2012-11-09 ENCOUNTER — Encounter: Payer: Self-pay | Admitting: *Deleted

## 2012-11-09 LAB — REMOTE ICD DEVICE
BATTERY VOLTAGE: 2.66 V
RV LEAD AMPLITUDE: 13 mv
TOT-0006: 20131023000000
TZAT-0005SLOWVT: 88 pct
TZAT-0005SLOWVT: 91 pct
TZAT-0011FASTVT: 10 ms
TZAT-0011SLOWVT: 10 ms
TZAT-0011SLOWVT: 10 ms
TZAT-0012FASTVT: 200 ms
TZAT-0012SLOWVT: 200 ms
TZAT-0012SLOWVT: 200 ms
TZAT-0013FASTVT: 1
TZAT-0013SLOWVT: 2
TZAT-0013SLOWVT: 2
TZAT-0018SLOWVT: NEGATIVE
TZAT-0018SLOWVT: NEGATIVE
TZAT-0019FASTVT: 8 V
TZAT-0019SLOWVT: 8 V
TZAT-0020FASTVT: 1.6 ms
TZON-0003FASTVT: 240 ms
TZON-0004SLOWVT: 40
TZON-0005SLOWVT: 24
TZON-0008FASTVT: 0 ms
TZON-0008SLOWVT: 0 ms
TZON-0010AFLUTTER: 40 ms
TZON-0010FASTVT: 40 ms
TZST-0001FASTVT: 3
TZST-0001FASTVT: 4
TZST-0001FASTVT: 6
TZST-0001SLOWVT: 4
TZST-0001SLOWVT: 6
TZST-0003FASTVT: 25 J
TZST-0003FASTVT: 35 J
TZST-0003FASTVT: 35 J
TZST-0003SLOWVT: 25 J
TZST-0003SLOWVT: 35 J
TZST-0003SLOWVT: 35 J
VENTRICULAR PACING ICD: 1 pct

## 2012-11-20 ENCOUNTER — Telehealth: Payer: Self-pay | Admitting: Cardiology

## 2012-11-20 NOTE — Telephone Encounter (Signed)
Needs refills on Furosedmide, Diovan, Simvastatin, Spiralactone, Digoxin and Amlodapine sent to [facility].  / tgs

## 2012-11-21 ENCOUNTER — Telehealth: Payer: Self-pay | Admitting: *Deleted

## 2012-11-21 MED ORDER — DIGOXIN 125 MCG PO TABS: 125.0000 ug | ORAL_TABLET | Freq: Every day | ORAL | Status: DC

## 2012-11-21 MED ORDER — SIMVASTATIN 20 MG PO TABS: 20.0000 mg | ORAL_TABLET | Freq: Every day | ORAL | Status: DC

## 2012-11-21 MED ORDER — SPIRONOLACTONE 25 MG PO TABS: 25.0000 mg | ORAL_TABLET | Freq: Every day | ORAL | Status: DC

## 2012-11-21 MED ORDER — AMLODIPINE BESYLATE 5 MG PO TABS: 5.0000 mg | ORAL_TABLET | Freq: Every day | ORAL | Status: DC

## 2012-11-21 MED ORDER — FUROSEMIDE 20 MG PO TABS: 20.0000 mg | ORAL_TABLET | ORAL | Status: DC

## 2012-11-21 MED ORDER — VALSARTAN 320 MG PO TABS: 320.0000 mg | ORAL_TABLET | Freq: Every day | ORAL | Status: DC

## 2012-11-28 ENCOUNTER — Other Ambulatory Visit: Payer: Self-pay | Admitting: Cardiology

## 2012-11-28 MED ORDER — SPIRONOLACTONE 25 MG PO TABS: 12.5000 mg | ORAL_TABLET | Freq: Every day | ORAL | Status: DC

## 2012-11-30 ENCOUNTER — Encounter: Payer: Self-pay | Admitting: *Deleted

## 2012-12-04 ENCOUNTER — Other Ambulatory Visit: Payer: Self-pay | Admitting: Cardiology

## 2012-12-04 MED ORDER — AMLODIPINE BESYLATE 5 MG PO TABS: 5.0000 mg | ORAL_TABLET | Freq: Every day | ORAL | Status: DC

## 2012-12-04 MED ORDER — CARVEDILOL 25 MG PO TABS: 50.0000 mg | ORAL_TABLET | Freq: Two times a day (BID) | ORAL | Status: DC

## 2012-12-04 MED ORDER — DIGOXIN 125 MCG PO TABS: 125.0000 ug | ORAL_TABLET | Freq: Every day | ORAL | Status: DC

## 2012-12-07 ENCOUNTER — Other Ambulatory Visit: Payer: Self-pay | Admitting: *Deleted

## 2012-12-11 ENCOUNTER — Other Ambulatory Visit: Payer: Self-pay | Admitting: Cardiology

## 2012-12-11 MED ORDER — AMLODIPINE BESYLATE 5 MG PO TABS: 5.0000 mg | ORAL_TABLET | Freq: Every day | ORAL | Status: DC

## 2012-12-11 MED ORDER — DIGOXIN 125 MCG PO TABS: 125.0000 ug | ORAL_TABLET | Freq: Every day | ORAL | Status: DC

## 2012-12-11 MED ORDER — CARVEDILOL 25 MG PO TABS: 50.0000 mg | ORAL_TABLET | Freq: Two times a day (BID) | ORAL | Status: DC

## 2013-02-05 ENCOUNTER — Encounter: Payer: Medicare Other | Admitting: *Deleted

## 2013-02-09 ENCOUNTER — Encounter: Payer: Self-pay | Admitting: Internal Medicine

## 2013-02-09 ENCOUNTER — Ambulatory Visit (INDEPENDENT_AMBULATORY_CARE_PROVIDER_SITE_OTHER): Payer: Medicare Other | Admitting: *Deleted

## 2013-02-09 DIAGNOSIS — I428 Other cardiomyopathies: Secondary | ICD-10-CM

## 2013-02-09 DIAGNOSIS — Z95810 Presence of automatic (implantable) cardiac defibrillator: Secondary | ICD-10-CM

## 2013-02-19 ENCOUNTER — Encounter: Payer: Self-pay | Admitting: *Deleted

## 2013-02-22 LAB — REMOTE ICD DEVICE
DEV-0020ICD: NEGATIVE
RV LEAD AMPLITUDE: 9.7 mv
TZAT-0001SLOWVT: 1
TZAT-0001SLOWVT: 2
TZAT-0012FASTVT: 200 ms
TZAT-0013SLOWVT: 2
TZAT-0013SLOWVT: 2
TZAT-0018FASTVT: NEGATIVE
TZAT-0018SLOWVT: NEGATIVE
TZAT-0018SLOWVT: NEGATIVE
TZAT-0019FASTVT: 8 V
TZAT-0019SLOWVT: 8 V
TZAT-0019SLOWVT: 8 V
TZAT-0020FASTVT: 1.6 ms
TZAT-0020SLOWVT: 1.6 ms
TZON-0004SLOWVT: 40
TZON-0005SLOWVT: 24
TZON-0008FASTVT: 0 ms
TZON-0008SLOWVT: 0 ms
TZON-0010AFLUTTER: 40 ms
TZON-0010FASTVT: 40 ms
TZON-0010SLOWVT: 40 ms
TZST-0001FASTVT: 4
TZST-0001FASTVT: 6
TZST-0001SLOWVT: 3
TZST-0001SLOWVT: 6
TZST-0003FASTVT: 25 J
TZST-0003FASTVT: 35 J
TZST-0003SLOWVT: 25 J
TZST-0003SLOWVT: 35 J

## 2013-03-01 ENCOUNTER — Encounter: Payer: Self-pay | Admitting: *Deleted

## 2013-04-11 ENCOUNTER — Ambulatory Visit (INDEPENDENT_AMBULATORY_CARE_PROVIDER_SITE_OTHER): Payer: Medicare Other | Admitting: Internal Medicine

## 2013-04-11 ENCOUNTER — Encounter: Payer: Self-pay | Admitting: Internal Medicine

## 2013-04-11 VITALS — BP 122/78 | HR 80 | Ht 69.0 in | Wt 233.1 lb

## 2013-04-11 DIAGNOSIS — Z95810 Presence of automatic (implantable) cardiac defibrillator: Secondary | ICD-10-CM

## 2013-04-11 DIAGNOSIS — I4891 Unspecified atrial fibrillation: Secondary | ICD-10-CM

## 2013-04-11 LAB — ICD DEVICE OBSERVATION
BRDY-0002RV: 40 {beats}/min
CHARGE TIME: 11.54 s
DEV-0020ICD: NEGATIVE
PACEART VT: 0
RV LEAD AMPLITUDE: 10.9 mv
RV LEAD THRESHOLD: 1 V
TZAT-0001FASTVT: 1
TZAT-0004FASTVT: 8
TZAT-0011SLOWVT: 10 ms
TZAT-0011SLOWVT: 10 ms
TZAT-0012FASTVT: 200 ms
TZAT-0012SLOWVT: 200 ms
TZAT-0012SLOWVT: 200 ms
TZAT-0013FASTVT: 1
TZAT-0018FASTVT: NEGATIVE
TZAT-0018SLOWVT: NEGATIVE
TZAT-0018SLOWVT: NEGATIVE
TZAT-0019FASTVT: 8 V
TZAT-0019SLOWVT: 8 V
TZAT-0019SLOWVT: 8 V
TZAT-0020FASTVT: 1.6 ms
TZON-0003FASTVT: 240 ms
TZON-0003SLOWVT: 330 ms
TZON-0005SLOWVT: 24
TZON-0008FASTVT: 0 ms
TZON-0008SLOWVT: 0 ms
TZON-0010AFLUTTER: 40 ms
TZON-0010FASTVT: 40 ms
TZST-0001FASTVT: 4
TZST-0001SLOWVT: 4
TZST-0003FASTVT: 35 J
TZST-0003FASTVT: 35 J
TZST-0003SLOWVT: 25 J
TZST-0003SLOWVT: 35 J
VENTRICULAR PACING ICD: 1 pct

## 2013-04-11 MED ORDER — APIXABAN 5 MG PO TABS: 5.0000 mg | ORAL_TABLET | Freq: Two times a day (BID) | ORAL | Status: DC

## 2013-04-11 NOTE — Patient Instructions (Addendum)
Your physician recommends that you schedule a follow-up appointment with Dr Ladona Ridgel in 1 year  You will receive a reminder letter in the mail in about 10 months reminding you to call and schedule your appointment. If you don't receive this letter, please contact our office.  And device check on 07-16-2013.  Your physician has recommended you make the following change in your medication: Start Eliquis 5 mg twice a day.  Your physician recommends that you return for lab work in 1 Month BMET.

## 2013-04-11 NOTE — Assessment & Plan Note (Signed)
His device is working normally and he is approaching elective replacement. In the past, his left ventricular function has improved. We would recommend repeat of his echocardiogram prior to any ICD reimplantation. If his ejection fraction has normalized or is greater than 35%, ICD reimplantation would not be indicated.

## 2013-04-11 NOTE — Assessment & Plan Note (Signed)
The patient has had bleeding problems on Coumadin in the past. Today we discussed one of the newer novel agents which has less risk of bleeding. He will undergo a trial of Eliquis. If he has additional bleeding, this medication will be stopped. The data in prior trials however demonstrated that this bleeding risk for Eliquis was comparable to the bleeding risk of aspirin alone.

## 2013-04-11 NOTE — Progress Notes (Signed)
HPI [name] returns today for followup. He is a very pleasant 72 year old man with a nonischemic cardiomyopathy, chronic systolic heart failure, and atrial fibrillation. In the past, he has been on warfarin. He developed a bloody cyst on his kidney. In addition he has had epistaxis. He has never tried one of our newer novel agents however. He denies chest pain, shortness of breath, or peripheral edema. No syncope. No ICD shock. No Known Allergies   Current Outpatient Prescriptions  Medication Sig Dispense Refill  . Acetaminophen (TYLENOL PO) Take by mouth as needed.        Marland Kitchen amLODipine (NORVASC) 5 MG tablet Take 1 tablet (5 mg total) by mouth daily.  90 tablet  2  . aspirin 325 MG tablet Take 325 mg by mouth daily.        . calcium citrate (CALCITRATE - DOSED IN MG ELEMENTAL CALCIUM) 950 MG tablet Take 1 tablet by mouth daily.        . carvedilol (COREG) 25 MG tablet Take 2 tablets (50 mg total) by mouth 2 (two) times daily with a meal.  360 tablet  2  . cetirizine (ZYRTEC) 10 MG tablet Take 10 mg by mouth as needed.        . Cholecalciferol (VITAMIN D) 400 UNITS capsule Take 5,000 Units by mouth daily.       . digoxin (LANOXIN) 0.125 MG tablet Take 1 tablet (125 mcg total) by mouth daily.  90 tablet  2  . furosemide (LASIX) 20 MG tablet Take 1 tablet (20 mg total) by mouth every other day.  45 tablet  3  . metFORMIN (GLUCOPHAGE) 500 MG tablet Take 500 mg by mouth 2 (two) times daily with a meal.       . Multiple Vitamins-Minerals (MULTIVITAMIN WITH MINERALS) tablet Take 1 tablet by mouth daily.        . simvastatin (ZOCOR) 20 MG tablet Take 1 tablet (20 mg total) by mouth at bedtime.  90 tablet  3  . spironolactone (ALDACTONE) 25 MG tablet Take 0.5 tablets (12.5 mg total) by mouth daily.  45 tablet  3  . valsartan (DIOVAN) 320 MG tablet Take 1 tablet (320 mg total) by mouth daily.  90 tablet  3   No current facility-administered medications for this visit.     Past Medical History   Diagnosis Date  . Atrial fibrillation     2006: epistaxis, UGI bleed and renal hematoma on coumadin->partial nephrectomy; subsequently has refused anticoagulation  . Non-ischemic cardiomyopathy      EF-35% in 2005, 40-45% in 2009; AICD in 2006  . Arteriosclerotic cardiovascular disease (ASCVD)      non-critical with LM, LAD, RCA <50%; 75% Cx in a small vessel  . Hypertension 2006    AUTOMATIC IMPLANTABLE CARDIAC DEFIBRILLATOR -  . Tobacco abuse, in remission 1995    30 pk-yrs; D/C in 1995  . Diabetes mellitus type II     Borderline  . Chronic kidney disease     +ARF, but creat. nl in 2009; 0.87 in 08/2011  . Hyperlipidemia   . Colon polyps   . Psoriasis     ROS:   All systems reviewed and negative except as noted in the HPI.   Past Surgical History  Procedure Laterality Date  . Cardiac defibrillator placement      Dr. Ladona Ridgel 08/2005.  [employer]  . Partial nephrectomy    . Colonoscopy  2012    Negative screening study; remote history of polypectomy  History reviewed. No pertinent family history.   History   Social History  . Marital Status: Married    Spouse Name: N/A    Number of Children: N/A  . Years of Education: N/A   Occupational History  . Retired     Doctor, hospital   Social History Main Topics  . Smoking status: Former Smoker -- 1.50 packs/day for 40 years    Quit date: 10/11/1993  . Smokeless tobacco: Never Used  . Alcohol Use: 0.5 oz/week    1 drink(s) per week  . Drug Use: No  . Sexually Active: Not on file   Other Topics Concern  . Not on file   Social History Narrative  . No narrative on file     BP 122/78  Pulse 80  Ht 5\' 9"  (1.753 m)  Wt 233 lb 1.9 oz (105.743 kg)  BMI 34.41 kg/m2  SpO2 95%  Physical Exam:  Well appearing 72 year old man, NAD HEENT: Unremarkable Neck:  No JVD, no thyromegally Lungs:  Clear with no wheezes, rales, or rhonchi. HEART:  Regular rate rhythm, no murmurs, no rubs, no clicks Abd:  soft,  positive bowel sounds, no organomegally, no rebound, no guarding Ext:  2 plus pulses, no edema, no cyanosis, no clubbing Skin:  No rashes no nodules Neuro:  CN II through XII intact, motor grossly intact  DEVICE  Normal device function.  See PaceArt for details.   Assess/Plan:

## 2013-04-30 ENCOUNTER — Telehealth: Payer: Self-pay | Admitting: *Deleted

## 2013-04-30 DIAGNOSIS — I4891 Unspecified atrial fibrillation: Secondary | ICD-10-CM

## 2013-04-30 MED ORDER — APIXABAN 5 MG PO TABS: 5.0000 mg | ORAL_TABLET | Freq: Two times a day (BID) | ORAL | Status: DC

## 2013-04-30 NOTE — Telephone Encounter (Signed)
Medication sent via escribe.  

## 2013-04-30 NOTE — Telephone Encounter (Signed)
Pt needs eliquis called in [facility]

## 2013-05-10 ENCOUNTER — Encounter: Payer: Self-pay | Admitting: *Deleted

## 2013-05-10 ENCOUNTER — Telehealth: Payer: Self-pay | Admitting: *Deleted

## 2013-05-10 DIAGNOSIS — I4891 Unspecified atrial fibrillation: Secondary | ICD-10-CM

## 2013-05-10 DIAGNOSIS — Z95810 Presence of automatic (implantable) cardiac defibrillator: Secondary | ICD-10-CM

## 2013-05-10 NOTE — Telephone Encounter (Signed)
Sent lab slip in mail for pt.

## 2013-05-22 ENCOUNTER — Ambulatory Visit (INDEPENDENT_AMBULATORY_CARE_PROVIDER_SITE_OTHER): Payer: Medicare Other | Admitting: Adult Health

## 2013-05-22 ENCOUNTER — Encounter: Payer: Self-pay | Admitting: Adult Health

## 2013-05-22 VITALS — BP 110/64 | HR 69 | Ht 70.0 in | Wt 225.8 lb

## 2013-05-22 DIAGNOSIS — I255 Ischemic cardiomyopathy: Secondary | ICD-10-CM

## 2013-05-22 DIAGNOSIS — E785 Hyperlipidemia, unspecified: Secondary | ICD-10-CM

## 2013-05-22 DIAGNOSIS — I2510 Atherosclerotic heart disease of native coronary artery without angina pectoris: Secondary | ICD-10-CM

## 2013-05-22 DIAGNOSIS — I7090 Unspecified atherosclerosis: Secondary | ICD-10-CM

## 2013-05-22 DIAGNOSIS — I10 Essential (primary) hypertension: Secondary | ICD-10-CM

## 2013-05-22 DIAGNOSIS — I2589 Other forms of chronic ischemic heart disease: Secondary | ICD-10-CM

## 2013-05-22 DIAGNOSIS — I4891 Unspecified atrial fibrillation: Secondary | ICD-10-CM

## 2013-05-22 DIAGNOSIS — I428 Other cardiomyopathies: Secondary | ICD-10-CM

## 2013-05-22 NOTE — Assessment & Plan Note (Signed)
Excellent control of BP. CPAP is helpful in this regard. No changes in his medications at this time.

## 2013-05-22 NOTE — Assessment & Plan Note (Signed)
Will repeat echocardiogram for ongoing assessment of his LV fx. Will remain on optimal medical therapy coreg, diovan, aldactone, digoxin and lasix. He will be seen again in 6 months unless symptomatic. Labs have been requested.

## 2013-05-22 NOTE — Progress Notes (Deleted)
Name: [name]    DOB: February 01, 1941  Age: 72 y.o.  MR#: [id number]       PCP:  Exie Parody, MD      Insurance: Payor: MEDICARE / Plan: MEDICARE PART A AND B / Product Type: *No Product type* /   CC:    Chief Complaint  Patient presents with  . Coronary Artery Disease  . Atrial Fibrillation    VS Filed Vitals:   05/22/13 1114  BP: 110/64  Pulse: 69  Height: 5\' 10"  (1.778 m)  Weight: 225 lb 12.8 oz (102.422 kg)  SpO2: 95%    Weights Current Weight  05/22/13 225 lb 12.8 oz (102.422 kg)  04/11/13 233 lb 1.9 oz (105.743 kg)  05/24/12 232 lb (105.235 kg)    Blood Pressure  BP Readings from Last 3 Encounters:  05/22/13 110/64  04/11/13 122/78  05/24/12 110/73     Admit date:  (Not on file) Last encounter with RMR:  Visit date not found   Allergy Review of patient's allergies indicates no known allergies.  Current Outpatient Prescriptions  Medication Sig Dispense Refill  . Acetaminophen (TYLENOL PO) Take by mouth as needed.        Marland Kitchen amLODipine (NORVASC) 5 MG tablet Take 1 tablet (5 mg total) by mouth daily.  90 tablet  2  . aspirin 325 MG tablet Take 325 mg by mouth daily.        . calcium citrate (CALCITRATE - DOSED IN MG ELEMENTAL CALCIUM) 950 MG tablet Take 1 tablet by mouth daily.        . carvedilol (COREG) 25 MG tablet Take 2 tablets (50 mg total) by mouth 2 (two) times daily with a meal.  360 tablet  2  . Cholecalciferol (VITAMIN D) 400 UNITS capsule Take 5,000 Units by mouth daily.       . digoxin (LANOXIN) 0.125 MG tablet Take 1 tablet (125 mcg total) by mouth daily.  90 tablet  2  . furosemide (LASIX) 20 MG tablet Take 1 tablet (20 mg total) by mouth every other day.  45 tablet  3  . metFORMIN (GLUCOPHAGE) 500 MG tablet Take 500 mg by mouth 2 (two) times daily with a meal.       . Multiple Vitamins-Minerals (MULTIVITAMIN WITH MINERALS) tablet Take 1 tablet by mouth daily.        . simvastatin (ZOCOR) 20 MG tablet Take 1 tablet (20 mg total) by mouth at  bedtime.  90 tablet  3  . spironolactone (ALDACTONE) 25 MG tablet Take 0.5 tablets (12.5 mg total) by mouth daily.  45 tablet  3  . valsartan (DIOVAN) 320 MG tablet Take 1 tablet (320 mg total) by mouth daily.  90 tablet  3  . apixaban (ELIQUIS) 5 MG TABS tablet Take 1 tablet (5 mg total) by mouth 2 (two) times daily.  60 tablet  3   No current facility-administered medications for this visit.    Discontinued Meds:    Medications Discontinued During This Encounter  Medication Reason  . cetirizine (ZYRTEC) 10 MG tablet Error    Patient Active Problem List   Diagnosis Date Noted  . Atrial fibrillation   . Non-ischemic cardiomyopathy   . Arteriosclerotic cardiovascular disease (ASCVD)   . Hypertension   . Tobacco abuse, in remission   . Hyperlipidemia   . Psoriasis   . Diabetes mellitus type II   . AUTOMATIC IMPLANTABLE CARDIAC DEFIBRILLATOR SITU 03/11/2009    LABS    Component  Value Date/Time   NA 140 08/20/2011 1450   NA 139 12/21/2010 1710   NA 139 04/16/2010 2213   K 4.6 08/20/2011 1450   K 5.1 12/21/2010 1710   K 4.8 04/16/2010 2213   CL 105 08/20/2011 1450   CL 100 12/21/2010 1710   CL 104 04/16/2010 2213   CO2 25 08/20/2011 1450   CO2 28 12/21/2010 1710   CO2 24 04/16/2010 2213   GLUCOSE 140* 08/20/2011 1450   GLUCOSE 148* 12/21/2010 1710   GLUCOSE 144* 04/16/2010 2213   BUN 21 08/20/2011 1450   BUN 21 12/21/2010 1710   BUN 19 04/16/2010 2213   CREATININE 0.87 08/20/2011 1450   CREATININE 1.05 12/21/2010 1710   CREATININE 0.90 04/16/2010 2213   CREATININE 0.89 03/26/2008   CALCIUM 9.5 08/20/2011 1450   CALCIUM 9.9 12/21/2010 1710   CALCIUM 9.4 04/16/2010 2213   CMP     Component Value Date/Time   NA 140 08/20/2011 1450   K 4.6 08/20/2011 1450   CL 105 08/20/2011 1450   CO2 25 08/20/2011 1450   GLUCOSE 140* 08/20/2011 1450   BUN 21 08/20/2011 1450   CREATININE 0.87 08/20/2011 1450   CREATININE 1.05 12/21/2010 1710   CALCIUM 9.5 08/20/2011 1450   PROT 7.4 09/24/2011 1035   ALBUMIN 4.5  09/24/2011 1035   AST 20 09/24/2011 1035   ALT 23 09/24/2011 1035   ALKPHOS 52 09/24/2011 1035   BILITOT 0.5 09/24/2011 1035       Component Value Date/Time   WBC 8.1 04/16/2010 2213   HGB 15.0 04/16/2010 2213   HCT 45.8 04/16/2010 2213   MCV 91.8 04/16/2010 2213    Lipid Panel     Component Value Date/Time   CHOL 126 09/24/2011 1035   TRIG 115 09/24/2011 1035   HDL 31* 09/24/2011 1035   CHOLHDL 4.1 09/24/2011 1035   VLDL 23 09/24/2011 1035   LDLCALC 72 09/24/2011 1035    ABG No results found for this basename: phart, pco2, pco2art, po2, po2art, hco3, tco2, acidbasedef, o2sat     No results found for this basename: TSH   BNP (last 3 results) No results found for this basename: PROBNP,  in the last 8760 hours Cardiac Panel (last 3 results) No results found for this basename: CKTOTAL, CKMB, TROPONINI, RELINDX,  in the last 72 hours  Iron/TIBC/Ferritin No results found for this basename: iron, tibc, ferritin     EKG Orders placed in visit on 04/11/13  . EKG 12-LEAD     Prior Assessment and Plan Problem List as of 05/22/2013     Cardiovascular and Mediastinum   Atrial fibrillation   Last Assessment & Plan   04/11/2013 Office Visit Written 04/11/2013  9:15 AM by Marinus Maw, MD     The patient has had bleeding problems on Coumadin in the past. Today we discussed one of the newer novel agents which has less risk of bleeding. He will undergo a trial of Eliquis. If he has additional bleeding, this medication will be stopped. The data in prior trials however demonstrated that this bleeding risk for Eliquis was comparable to the bleeding risk of aspirin alone.    Non-ischemic cardiomyopathy   Arteriosclerotic cardiovascular disease (ASCVD)   Last Assessment & Plan   05/24/2012 Office Visit Written 05/24/2012  4:02 PM by Kathlen Brunswick, MD     The patient has never had a known events related to coronary artery disease and has no symptoms now highly suggestive for  same.  We will  continue to approach moderate CAD by optimal control of cardiovascular risk factors.    Hypertension   Last Assessment & Plan   05/24/2012 Office Visit Written 05/24/2012  4:05 PM by Kathlen Brunswick, MD     Blood pressure control has been excellent with current medication, much of which was likely started to treat his cardiomyopathy rather than what appears to be mild hypertension.  No change in therapy is warranted.      Endocrine   Diabetes mellitus type II     Musculoskeletal and Integument   Psoriasis     Other   AUTOMATIC IMPLANTABLE CARDIAC DEFIBRILLATOR SITU   Last Assessment & Plan   04/11/2013 Office Visit Written 04/11/2013  9:14 AM by Marinus Maw, MD     His device is working normally and he is approaching elective replacement. In the past, his left ventricular function has improved. We would recommend repeat of his echocardiogram prior to any ICD reimplantation. If his ejection fraction has normalized or is greater than 35%, ICD reimplantation would not be indicated.    Tobacco abuse, in remission   Hyperlipidemia   Last Assessment & Plan   05/24/2012 Office Visit Written 05/24/2012  4:04 PM by Kathlen Brunswick, MD     Lipid profile was excellent when last assessed less than one year ago.  Current efficacious therapy will be continued.        Imaging: No results found.

## 2013-05-22 NOTE — Patient Instructions (Signed)
Your physician recommends that you schedule a follow-up appointment in: 6 MONTHS. Reminder 4  Your physician has requested that you have an echocardiogram. Echocardiography is a painless test that uses sound waves to create images of your heart. It provides your doctor with information about the size and shape of your heart and how well your heart's chambers and valves are working. This procedure takes approximately one hour. There are no restrictions for this procedure.

## 2013-05-22 NOTE — Assessment & Plan Note (Signed)
He offers no cardiac complaints of angina or fatigue. Risk management will be our focus at this time. Continue on simvastatin.

## 2013-05-22 NOTE — Assessment & Plan Note (Signed)
Heart rate is well controlled. He is not taking anticoagulation as he refuses at this time. Eliquis has been ordered but required a pre-authorization. This is ongoing. If he is willing to take this and he is eligible, will prescribe.

## 2013-05-22 NOTE — Progress Notes (Signed)
HPI: Mr. Aikman is a 72 year old patient of Dr. Dickinson Bing we are following for ongoing assessment and management of CAD, atrial fibrillation, ICD in situ, with history of hyperlipidemia and hypertension. Patient was last seen in the office one year ago, was doing well, heart rate was controlled. He was seen by Dr. Ladona Ridgel for ICD check and management of chronic atrial fibrillation in July of 2014. No medication changes were made.    He comes today without any complaints. The patient has not had any ER visits, hospitalizations, and surgeries, or new diagnoses since being seen last. Patient is followed by Dr. Allene Dillon and has had labs completed one week ago to include fasting lipids and LFTs. The patient states that he has been using his CPAP every night for history of obstructive sleep apnea and is feeling much better and refuses to sleep without it now. He is medically compliant. He is no longer on anticoagulation.  No Known Allergies  Current Outpatient Prescriptions  Medication Sig Dispense Refill  . Acetaminophen (TYLENOL PO) Take by mouth as needed.        Marland Kitchen amLODipine (NORVASC) 5 MG tablet Take 1 tablet (5 mg total) by mouth daily.  90 tablet  2  . aspirin 325 MG tablet Take 325 mg by mouth daily.        . calcium citrate (CALCITRATE - DOSED IN MG ELEMENTAL CALCIUM) 950 MG tablet Take 1 tablet by mouth daily.        . carvedilol (COREG) 25 MG tablet Take 2 tablets (50 mg total) by mouth 2 (two) times daily with a meal.  360 tablet  2  . Cholecalciferol (VITAMIN D) 400 UNITS capsule Take 5,000 Units by mouth daily.       . digoxin (LANOXIN) 0.125 MG tablet Take 1 tablet (125 mcg total) by mouth daily.  90 tablet  2  . furosemide (LASIX) 20 MG tablet Take 1 tablet (20 mg total) by mouth every other day.  45 tablet  3  . metFORMIN (GLUCOPHAGE) 500 MG tablet Take 500 mg by mouth 2 (two) times daily with a meal.       . Multiple Vitamins-Minerals (MULTIVITAMIN WITH MINERALS) tablet Take 1  tablet by mouth daily.        . simvastatin (ZOCOR) 20 MG tablet Take 1 tablet (20 mg total) by mouth at bedtime.  90 tablet  3  . spironolactone (ALDACTONE) 25 MG tablet Take 0.5 tablets (12.5 mg total) by mouth daily.  45 tablet  3  . valsartan (DIOVAN) 320 MG tablet Take 1 tablet (320 mg total) by mouth daily.  90 tablet  3  . apixaban (ELIQUIS) 5 MG TABS tablet Take 1 tablet (5 mg total) by mouth 2 (two) times daily.  60 tablet  3   No current facility-administered medications for this visit.    Past Medical History  Diagnosis Date  . Atrial fibrillation     2006: epistaxis, UGI bleed and renal hematoma on coumadin->partial nephrectomy; subsequently has refused anticoagulation  . Non-ischemic cardiomyopathy      EF-35% in 2005, 40-45% in 2009; AICD in 2006  . Arteriosclerotic cardiovascular disease (ASCVD)      non-critical with LM, LAD, RCA <50%; 75% Cx in a small vessel  . Hypertension 2006    AUTOMATIC IMPLANTABLE CARDIAC DEFIBRILLATOR -  . Tobacco abuse, in remission 1995    30 pk-yrs; D/C in 1995  . Diabetes mellitus type II     Borderline  .  Chronic kidney disease     +ARF, but creat. nl in 2009; 0.87 in 08/2011  . Hyperlipidemia   . Colon polyps   . Psoriasis     Past Surgical History  Procedure Laterality Date  . Cardiac defibrillator placement      Dr. Ladona Ridgel 08/2005.  [employer]  . Partial nephrectomy    . Colonoscopy  2012    Negative screening study; remote history of polypectomy    ZOX:WRUEAV of systems complete and found to be negative unless listed above  PHYSICAL EXAM BP 110/64  Pulse 69  Ht 5\' 10"  (1.778 m)  Wt 225 lb 12.8 oz (102.422 kg)  BMI 32.4 kg/m2  SpO2 95%  General: Well developed, well nourished, in no acute distress Head: Eyes PERRLA, No xanthomas.   Normal cephalic and atramatic  Lungs: Clear bilaterally to auscultation, no wheezes or coughing. Heart: HRRR S1 S2, distant heart sounds without MRG.  Pulses are 2+ & equal.            No  carotid bruit. No JVD.  No abdominal bruits.  Abdomen: Bowel sounds are positive, abdomen soft and non-tender without masses or                  Hernia's noted. Msk:  Back normal, normal gait. Normal strength and tone for age. Extremities: No clubbing, cyanosis or edema.  DP +1 Neuro: Alert and oriented X 3. Psych:  Good affect, responds appropriately   ASSESSMENT AND PLAN

## 2013-05-28 ENCOUNTER — Ambulatory Visit (HOSPITAL_COMMUNITY)
Admission: RE | Admit: 2013-05-28 | Discharge: 2013-05-28 | Disposition: A | Discharge status: Home / Self Care | Payer: Medicare Other | Source: Ambulatory Visit | Attending: Adult Health | Admitting: Adult Health

## 2013-05-28 DIAGNOSIS — I10 Essential (primary) hypertension: Secondary | ICD-10-CM | POA: Insufficient documentation

## 2013-05-28 DIAGNOSIS — I2510 Atherosclerotic heart disease of native coronary artery without angina pectoris: Secondary | ICD-10-CM

## 2013-05-28 DIAGNOSIS — I428 Other cardiomyopathies: Secondary | ICD-10-CM | POA: Insufficient documentation

## 2013-05-28 DIAGNOSIS — E119 Type 2 diabetes mellitus without complications: Secondary | ICD-10-CM | POA: Insufficient documentation

## 2013-05-28 DIAGNOSIS — E785 Hyperlipidemia, unspecified: Secondary | ICD-10-CM

## 2013-05-28 DIAGNOSIS — I255 Ischemic cardiomyopathy: Secondary | ICD-10-CM

## 2013-05-28 DIAGNOSIS — I4891 Unspecified atrial fibrillation: Secondary | ICD-10-CM | POA: Insufficient documentation

## 2013-05-28 DIAGNOSIS — I369 Nonrheumatic tricuspid valve disorder, unspecified: Secondary | ICD-10-CM

## 2013-05-28 NOTE — Progress Notes (Signed)
*  PRELIMINARY RESULTS* Echocardiogram 2D Echocardiogram has been performed.  [name] 05/28/2013, 11:27 AM

## 2013-05-29 ENCOUNTER — Encounter: Payer: Self-pay | Admitting: Cardiology

## 2013-07-16 ENCOUNTER — Encounter: Payer: Medicare Other | Admitting: *Deleted

## 2013-07-23 ENCOUNTER — Encounter: Payer: Self-pay | Admitting: *Deleted

## 2013-07-27 ENCOUNTER — Ambulatory Visit (INDEPENDENT_AMBULATORY_CARE_PROVIDER_SITE_OTHER): Payer: Medicare Other | Admitting: *Deleted

## 2013-07-27 DIAGNOSIS — Z95810 Presence of automatic (implantable) cardiac defibrillator: Secondary | ICD-10-CM

## 2013-07-27 DIAGNOSIS — I428 Other cardiomyopathies: Secondary | ICD-10-CM

## 2013-07-27 LAB — REMOTE ICD DEVICE
BRDY-0002RV: 40 {beats}/min
DEV-0020ICD: NEGATIVE
RV LEAD AMPLITUDE: 10.2 mv
RV LEAD IMPEDENCE ICD: 592 Ohm
TOT-0006: 20140702000000
TZAT-0001FASTVT: 1
TZAT-0004SLOWVT: 8
TZAT-0004SLOWVT: 8
TZAT-0005FASTVT: 88 pct
TZAT-0011FASTVT: 10 ms
TZAT-0012SLOWVT: 200 ms
TZAT-0012SLOWVT: 200 ms
TZAT-0013FASTVT: 1
TZAT-0013SLOWVT: 2
TZAT-0013SLOWVT: 2
TZAT-0018FASTVT: NEGATIVE
TZAT-0019FASTVT: 8 V
TZAT-0019SLOWVT: 8 V
TZAT-0020SLOWVT: 1.6 ms
TZAT-0020SLOWVT: 1.6 ms
TZON-0003SLOWVT: 330 ms
TZON-0004SLOWVT: 40
TZON-0005SLOWVT: 24
TZON-0008FASTVT: 0 ms
TZON-0008SLOWVT: 0 ms
TZON-0010SLOWVT: 40 ms
TZST-0001FASTVT: 2
TZST-0001FASTVT: 3
TZST-0001FASTVT: 5
TZST-0001SLOWVT: 5
TZST-0001SLOWVT: 6
TZST-0003FASTVT: 35 J
TZST-0003SLOWVT: 25 J
TZST-0003SLOWVT: 35 J
VENTRICULAR PACING ICD: 1 pct

## 2013-08-08 ENCOUNTER — Encounter: Payer: Self-pay | Admitting: *Deleted

## 2013-08-08 ENCOUNTER — Encounter: Payer: Self-pay | Admitting: Internal Medicine

## 2013-08-08 ENCOUNTER — Telehealth: Payer: Self-pay | Admitting: Internal Medicine

## 2013-08-08 ENCOUNTER — Ambulatory Visit (INDEPENDENT_AMBULATORY_CARE_PROVIDER_SITE_OTHER): Payer: Medicare Other | Admitting: Internal Medicine

## 2013-08-08 VITALS — BP 121/73 | HR 70 | Ht 71.0 in | Wt 226.0 lb

## 2013-08-08 DIAGNOSIS — Z01818 Encounter for other preprocedural examination: Secondary | ICD-10-CM

## 2013-08-08 DIAGNOSIS — Z95810 Presence of automatic (implantable) cardiac defibrillator: Secondary | ICD-10-CM

## 2013-08-08 DIAGNOSIS — I428 Other cardiomyopathies: Secondary | ICD-10-CM

## 2013-08-08 LAB — ICD DEVICE OBSERVATION
DEV-0020ICD: NEGATIVE
FVT: 0
PACEART VT: 0
RV LEAD AMPLITUDE: 12.6 mv
RV LEAD THRESHOLD: 1 V
TZAT-0001SLOWVT: 1
TZAT-0004SLOWVT: 8
TZAT-0004SLOWVT: 8
TZAT-0005FASTVT: 88 pct
TZAT-0005SLOWVT: 88 pct
TZAT-0005SLOWVT: 91 pct
TZAT-0011FASTVT: 10 ms
TZAT-0011SLOWVT: 10 ms
TZAT-0011SLOWVT: 10 ms
TZAT-0012FASTVT: 200 ms
TZAT-0012SLOWVT: 200 ms
TZAT-0012SLOWVT: 200 ms
TZAT-0013SLOWVT: 2
TZAT-0013SLOWVT: 2
TZAT-0018FASTVT: NEGATIVE
TZAT-0018SLOWVT: NEGATIVE
TZAT-0018SLOWVT: NEGATIVE
TZAT-0019FASTVT: 8 V
TZON-0003FASTVT: 240 ms
TZON-0003SLOWVT: 330 ms
TZON-0004SLOWVT: 40
TZON-0005SLOWVT: 24
TZON-0008FASTVT: 0 ms
TZON-0010AFLUTTER: 40 ms
TZST-0001FASTVT: 3
TZST-0001FASTVT: 5
TZST-0001FASTVT: 6
TZST-0001SLOWVT: 3
TZST-0001SLOWVT: 4
TZST-0001SLOWVT: 6
TZST-0003FASTVT: 25 J
TZST-0003FASTVT: 35 J
TZST-0003SLOWVT: 25 J
VENTRICULAR PACING ICD: 1 pct

## 2013-08-08 LAB — CBC WITH DIFFERENTIAL/PLATELET
Eosinophils Absolute: 0.2 10*3/uL (ref 0.0–0.7)
Eosinophils Relative: 2 % (ref 0–5)
HCT: 40.5 % (ref 39.0–52.0)
Hemoglobin: 14 g/dL (ref 13.0–17.0)
Lymphs Abs: 2.7 10*3/uL (ref 0.7–4.0)
MCH: 30.9 pg (ref 26.0–34.0)
MCV: 89.4 fL (ref 78.0–100.0)
Monocytes Absolute: 0.6 10*3/uL (ref 0.1–1.0)
Monocytes Relative: 7 % (ref 3–12)
Platelets: 268 10*3/uL (ref 150–400)
RBC: 4.53 MIL/uL (ref 4.22–5.81)

## 2013-08-08 LAB — BASIC METABOLIC PANEL
BUN: 25 mg/dL — ABNORMAL HIGH (ref 6–23)
CO2: 26 mEq/L (ref 19–32)
Calcium: 9.9 mg/dL (ref 8.4–10.5)
Creat: 0.87 mg/dL (ref 0.50–1.35)
Glucose, Bld: 161 mg/dL — ABNORMAL HIGH (ref 70–99)

## 2013-08-08 MED ORDER — LOSARTAN POTASSIUM 50 MG PO TABS: 50.0000 mg | ORAL_TABLET | Freq: Every day | ORAL | Status: DC

## 2013-08-08 NOTE — Telephone Encounter (Signed)
New message    Saw Dr Ladona Ridgel in [place] today----having procedure 11-3 at [facility].  He want to change date to the next week---wife is out of town next week

## 2013-08-08 NOTE — Patient Instructions (Addendum)
Your physician recommends that you schedule a follow-up appointment in: 3 months with Dr [name] will receive a reminder letter two months in advance reminding you to call and schedule your appointment. If you don't receive this letter, please contact our office.  Your physician has recommended you make the following change in your medication:  1.Stop Diovan 2. Start Losartan 50 mg daily  ICD removal

## 2013-08-08 NOTE — Assessment & Plan Note (Signed)
His ejection fraction appears to normalize. His symptoms are class I. He'll continue his current medical therapy.

## 2013-08-08 NOTE — Progress Notes (Signed)
HPI [name] returns today for followup. He is a very pleasant 72-year-old man with a nonischemic cardiomyopathy, chronic systolic heart failure, and atrial fibrillation. In the past, he has been on warfarin. No syncope. No ICD shock. The patient denies chest pain or shortness of breath. He states that his energy level is much improved compared to several years ago. A most recent echocardiogram suggests that his left ventricular function is near normal. No Known Allergies   Current Outpatient Prescriptions  Medication Sig Dispense Refill  . Acetaminophen (TYLENOL PO) Take by mouth as needed.        . amLODipine (NORVASC) 5 MG tablet Take 1 tablet (5 mg total) by mouth daily.  90 tablet  2  . apixaban (ELIQUIS) 5 MG TABS tablet Take 1 tablet (5 mg total) by mouth 2 (two) times daily.  60 tablet  3  . aspirin 325 MG tablet Take 325 mg by mouth daily.        . calcium citrate (CALCITRATE - DOSED IN MG ELEMENTAL CALCIUM) 950 MG tablet Take 1 tablet by mouth daily.        . carvedilol (COREG) 25 MG tablet Take 2 tablets (50 mg total) by mouth 2 (two) times daily with a meal.  360 tablet  2  . Cholecalciferol (VITAMIN D) 400 UNITS capsule Take 5,000 Units by mouth daily.       . digoxin (LANOXIN) 0.125 MG tablet Take 1 tablet (125 mcg total) by mouth daily.  90 tablet  2  . furosemide (LASIX) 20 MG tablet Take 1 tablet (20 mg total) by mouth every other day.  45 tablet  3  . metFORMIN (GLUCOPHAGE) 500 MG tablet Take 500 mg by mouth 2 (two) times daily with a meal.       . Multiple Vitamins-Minerals (MULTIVITAMIN WITH MINERALS) tablet Take 1 tablet by mouth daily.        . simvastatin (ZOCOR) 20 MG tablet Take 1 tablet (20 mg total) by mouth at bedtime.  90 tablet  3  . spironolactone (ALDACTONE) 25 MG tablet Take 0.5 tablets (12.5 mg total) by mouth daily.  45 tablet  3  . valsartan (DIOVAN) 320 MG tablet Take 1 tablet (320 mg total) by mouth daily.  90 tablet  3   No current  facility-administered medications for this visit.     Past Medical History  Diagnosis Date  . Atrial fibrillation     2006: epistaxis, UGI bleed and renal hematoma on coumadin->partial nephrectomy; subsequently has refused anticoagulation  . Non-ischemic cardiomyopathy      EF-35% in 2005, 40-45% in 2009; AICD in 2006  . Arteriosclerotic cardiovascular disease (ASCVD)      non-critical with LM, LAD, RCA <50%; 75% Cx in a small vessel  . Hypertension 2006    AUTOMATIC IMPLANTABLE CARDIAC DEFIBRILLATOR -  . Tobacco abuse, in remission 1995    30 pk-yrs; D/C in 1995  . Diabetes mellitus type II     Borderline  . Chronic kidney disease     +ARF, but creat. nl in 2009; 0.87 in 08/2011  . Hyperlipidemia   . Colon polyps   . Psoriasis     ROS:   All systems reviewed and negative except as noted in the HPI.   Past Surgical History  Procedure Laterality Date  . Cardiac defibrillator placement      Dr. Taylor 08/2005.  [employer]  . Partial nephrectomy    . Colonoscopy  2012    Negative screening   study; remote history of polypectomy     History reviewed. No pertinent family history.   History   Social History  . Marital Status: Married    Spouse Name: N/A    Number of Children: N/A  . Years of Education: N/A   Occupational History  . 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 Smoking status: Former Smoker -- 1.50 packs/day for 40 years    Quit date: 10/11/1993  . Smokeless tobacco: Never Used  . Alcohol Use: 0.5 oz/week    1 drink(s) per week  . Drug Use: No  . Sexual Activity: Not on file   Other Topics Concern  . Not on file   Social History Narrative  . No narrative on file     BP 121/73  Pulse 70  Ht 5' 11" (1.803 m)  Wt 226 lb (102.513 kg)  BMI 31.53 kg/m2  Physical Exam:  Well appearing 72-year-old man, NAD HEENT: Unremarkable Neck:  No JVD, no thyromegally Lungs:  Clear with no wheezes, rales, or rhonchi. HEART:  Regular  rate rhythm, no murmurs, no rubs, no clicks Abd:  soft, positive bowel sounds, no organomegally, no rebound, no guarding Ext:  2 plus pulses, no edema, no cyanosis, no clubbing Skin:  No rashes no nodules Neuro:  CN II through XII intact, motor grossly intact  DEVICE  Normal device function.  See PaceArt for details.   Assess/Plan: 

## 2013-08-08 NOTE — Telephone Encounter (Signed)
Procedure for ICD removal has been changed due to pt wife out of town. Rescheduled for Monday August 20, 2012 at 7:30

## 2013-08-08 NOTE — Assessment & Plan Note (Signed)
His device has reached elective replacement. Because his left ventricular function has improved to near normal, and because he has had no malignant ventricular arrhythmias, I recommended that he not undergo insertion of a new device. When given the option of leaving the old device in place, versus removing the old device and capping the pacing and defibrillator lead, he would like to have the generator removed. We'll schedule this in several weeks.

## 2013-08-10 ENCOUNTER — Encounter: Payer: Self-pay | Admitting: Internal Medicine

## 2013-08-16 ENCOUNTER — Encounter (HOSPITAL_COMMUNITY): Payer: Self-pay

## 2013-08-19 MED ORDER — CEFAZOLIN SODIUM-DEXTROSE 2-3 GM-% IV SOLR
2.0000 g | INTRAVENOUS | Status: DC
  Filled 2013-08-19: qty 50

## 2013-08-19 MED ORDER — SODIUM CHLORIDE 0.9 % IR SOLN
80.0000 mg | Status: DC
  Filled 2013-08-19: qty 2

## 2013-08-20 ENCOUNTER — Encounter (HOSPITAL_COMMUNITY)
Admission: RE | Disposition: A | Discharge status: Home / Self Care | Payer: Self-pay | Source: Ambulatory Visit | Attending: Internal Medicine

## 2013-08-20 ENCOUNTER — Ambulatory Visit (HOSPITAL_COMMUNITY)
Admission: RE | Admit: 2013-08-20 | Discharge: 2013-08-20 | Disposition: A | Discharge status: Home / Self Care | Payer: Medicare Other | Source: Ambulatory Visit | Attending: Internal Medicine | Admitting: Internal Medicine

## 2013-08-20 DIAGNOSIS — E785 Hyperlipidemia, unspecified: Secondary | ICD-10-CM | POA: Insufficient documentation

## 2013-08-20 DIAGNOSIS — I428 Other cardiomyopathies: Secondary | ICD-10-CM | POA: Insufficient documentation

## 2013-08-20 DIAGNOSIS — R7309 Other abnormal glucose: Secondary | ICD-10-CM | POA: Insufficient documentation

## 2013-08-20 DIAGNOSIS — I2510 Atherosclerotic heart disease of native coronary artery without angina pectoris: Secondary | ICD-10-CM | POA: Insufficient documentation

## 2013-08-20 DIAGNOSIS — T82198A Other mechanical complication of other cardiac electronic device, initial encounter: Secondary | ICD-10-CM

## 2013-08-20 DIAGNOSIS — I4891 Unspecified atrial fibrillation: Secondary | ICD-10-CM | POA: Insufficient documentation

## 2013-08-20 DIAGNOSIS — L408 Other psoriasis: Secondary | ICD-10-CM | POA: Insufficient documentation

## 2013-08-20 DIAGNOSIS — N189 Chronic kidney disease, unspecified: Secondary | ICD-10-CM | POA: Insufficient documentation

## 2013-08-20 DIAGNOSIS — I129 Hypertensive chronic kidney disease with stage 1 through stage 4 chronic kidney disease, or unspecified chronic kidney disease: Secondary | ICD-10-CM | POA: Insufficient documentation

## 2013-08-20 DIAGNOSIS — Z7901 Long term (current) use of anticoagulants: Secondary | ICD-10-CM | POA: Insufficient documentation

## 2013-08-20 DIAGNOSIS — Z87891 Personal history of nicotine dependence: Secondary | ICD-10-CM | POA: Insufficient documentation

## 2013-08-20 DIAGNOSIS — Z4502 Encounter for adjustment and management of automatic implantable cardiac defibrillator: Secondary | ICD-10-CM | POA: Insufficient documentation

## 2013-08-20 DIAGNOSIS — I5022 Chronic systolic (congestive) heart failure: Secondary | ICD-10-CM | POA: Insufficient documentation

## 2013-08-20 HISTORY — PX: GENERATOR REMOVAL: SHX5468

## 2013-08-20 LAB — GLUCOSE, CAPILLARY: Glucose-Capillary: 173 mg/dL — ABNORMAL HIGH (ref 70–99)

## 2013-08-20 SURGERY — REMOVAL, PULSE GENERATOR, ICD
Anesthesia: LOCAL

## 2013-08-20 MED ORDER — MUPIROCIN 2 % EX OINT
TOPICAL_OINTMENT | Freq: Two times a day (BID) | CUTANEOUS | Status: DC
  Filled 2013-08-20: qty 22

## 2013-08-20 MED ORDER — CHLORHEXIDINE GLUCONATE 4 % EX LIQD
60.0000 mL | Freq: Once | CUTANEOUS | Status: DC
  Filled 2013-08-20: qty 60

## 2013-08-20 MED ORDER — MUPIROCIN 2 % EX OINT
TOPICAL_OINTMENT | CUTANEOUS | Status: AC
  Filled 2013-08-20: qty 22

## 2013-08-20 MED ORDER — SODIUM CHLORIDE 0.9 % IV SOLN
INTRAVENOUS | Status: DC
  Administered 2013-08-20: 06:00:00 via INTRAVENOUS

## 2013-08-20 MED ORDER — FENTANYL CITRATE 0.05 MG/ML IJ SOLN
INTRAMUSCULAR | Status: AC
  Filled 2013-08-20: qty 2

## 2013-08-20 MED ORDER — ACETAMINOPHEN 325 MG PO TABS
325.0000 mg | ORAL_TABLET | ORAL | Status: DC | PRN
  Filled 2013-08-20: qty 2

## 2013-08-20 MED ORDER — CEFAZOLIN SODIUM-DEXTROSE 2-3 GM-% IV SOLR
INTRAVENOUS | Status: AC
  Filled 2013-08-20: qty 50

## 2013-08-20 MED ORDER — ONDANSETRON HCL 4 MG/2ML IJ SOLN: 4.0000 mg | Freq: Four times a day (QID) | INTRAMUSCULAR | Status: DC | PRN

## 2013-08-20 MED ORDER — LIDOCAINE HCL (PF) 1 % IJ SOLN
INTRAMUSCULAR | Status: AC
  Filled 2013-08-20: qty 60

## 2013-08-20 MED ORDER — MIDAZOLAM HCL 5 MG/5ML IJ SOLN
INTRAMUSCULAR | Status: AC
  Filled 2013-08-20: qty 5

## 2013-08-20 NOTE — CV Procedure (Signed)
Electrophysiology procedure note  Procedure: Removal of a previous implanted ICD with capping of the previously implanted ICD lead  Indication: Status post remote ICD implantation, with the patient's left ventricular function nearly normalized and the device at elective replacement  Findings: After informed consent was obtained, the patient was taken to the diagnostic electrophysiology laboratory in the fasting state. After the usual preparation and draping, intravenous fentanyl and Versed were given for sedation. 30 cc of lidocaine was infiltrated into the left ICD incision. A 7 cm incision was carried out over this region, and electrocautery was utilized to dissect down to the fascial plane. The ICD pocket was entered and the dense fibrous scar tissue was freed up with the electrocautery. The generator was removed. The lead was disconnected from the generator. The lead was capped. The lead was placed back in the subcutaneous pocket. The pocket was irrigated. Electrocautery was utilized to assure hemostasis. The incision was closed with 2-0 and 3-0 Vicryl suture. Benzoin and Steri-Strips were pain over the skin, pressure was held, and the incision was bandaged, and the patient was returned to his room in satisfactory condition.  Complications: There were no complications.  Results: This demonstrates successful removal of a previous implanted ICD generator which had reached elective replacement and for which a new ICD was not implanted as the patient's left ventricular function had normalized, and he had not received any appropriate ICD shock.   Lewayne Bunting, M.D.

## 2013-08-20 NOTE — H&P (View-Only) (Signed)
HPI [name] returns today for followup. He is a very pleasant 72 year old man with a nonischemic cardiomyopathy, chronic systolic heart failure, and atrial fibrillation. In the past, he has been on warfarin. No syncope. No ICD shock. The patient denies chest pain or shortness of breath. He states that his energy level is much improved compared to several years ago. A most recent echocardiogram suggests that his left ventricular function is near normal. No Known Allergies   Current Outpatient Prescriptions  Medication Sig Dispense Refill  . Acetaminophen (TYLENOL PO) Take by mouth as needed.        Marland Kitchen amLODipine (NORVASC) 5 MG tablet Take 1 tablet (5 mg total) by mouth daily.  90 tablet  2  . apixaban (ELIQUIS) 5 MG TABS tablet Take 1 tablet (5 mg total) by mouth 2 (two) times daily.  60 tablet  3  . aspirin 325 MG tablet Take 325 mg by mouth daily.        . calcium citrate (CALCITRATE - DOSED IN MG ELEMENTAL CALCIUM) 950 MG tablet Take 1 tablet by mouth daily.        . carvedilol (COREG) 25 MG tablet Take 2 tablets (50 mg total) by mouth 2 (two) times daily with a meal.  360 tablet  2  . Cholecalciferol (VITAMIN D) 400 UNITS capsule Take 5,000 Units by mouth daily.       . digoxin (LANOXIN) 0.125 MG tablet Take 1 tablet (125 mcg total) by mouth daily.  90 tablet  2  . furosemide (LASIX) 20 MG tablet Take 1 tablet (20 mg total) by mouth every other day.  45 tablet  3  . metFORMIN (GLUCOPHAGE) 500 MG tablet Take 500 mg by mouth 2 (two) times daily with a meal.       . Multiple Vitamins-Minerals (MULTIVITAMIN WITH MINERALS) tablet Take 1 tablet by mouth daily.        . simvastatin (ZOCOR) 20 MG tablet Take 1 tablet (20 mg total) by mouth at bedtime.  90 tablet  3  . spironolactone (ALDACTONE) 25 MG tablet Take 0.5 tablets (12.5 mg total) by mouth daily.  45 tablet  3  . valsartan (DIOVAN) 320 MG tablet Take 1 tablet (320 mg total) by mouth daily.  90 tablet  3   No current  facility-administered medications for this visit.     Past Medical History  Diagnosis Date  . Atrial fibrillation     2006: epistaxis, UGI bleed and renal hematoma on coumadin->partial nephrectomy; subsequently has refused anticoagulation  . Non-ischemic cardiomyopathy      EF-35% in 2005, 40-45% in 2009; AICD in 2006  . Arteriosclerotic cardiovascular disease (ASCVD)      non-critical with LM, LAD, RCA <50%; 75% Cx in a small vessel  . Hypertension 2006    AUTOMATIC IMPLANTABLE CARDIAC DEFIBRILLATOR -  . Tobacco abuse, in remission 1995    30 pk-yrs; D/C in 1995  . Diabetes mellitus type II     Borderline  . Chronic kidney disease     +ARF, but creat. nl in 2009; 0.87 in 08/2011  . Hyperlipidemia   . Colon polyps   . Psoriasis     ROS:   All systems reviewed and negative except as noted in the HPI.   Past Surgical History  Procedure Laterality Date  . Cardiac defibrillator placement      Dr. Ladona Ridgel 08/2005.  [employer]  . Partial nephrectomy    . Colonoscopy  2012    Negative screening  study; remote history of polypectomy     History reviewed. No pertinent family history.   History   Social History  . Marital Status: Married    Spouse Name: N/A    Number of Children: N/A  . Years of Education: N/A   Occupational History  . Retired     Doctor, hospital   Social History Main Topics  . Smoking status: Former Smoker -- 1.50 packs/day for 40 years    Quit date: 10/11/1993  . Smokeless tobacco: Never Used  . Alcohol Use: 0.5 oz/week    1 drink(s) per week  . Drug Use: No  . Sexual Activity: Not on file   Other Topics Concern  . Not on file   Social History Narrative  . No narrative on file     BP 121/73  Pulse 70  Ht 5\' 11"  (1.803 m)  Wt 226 lb (102.513 kg)  BMI 31.53 kg/m2  Physical Exam:  Well appearing 72 year old man, NAD HEENT: Unremarkable Neck:  No JVD, no thyromegally Lungs:  Clear with no wheezes, rales, or rhonchi. HEART:  Regular  rate rhythm, no murmurs, no rubs, no clicks Abd:  soft, positive bowel sounds, no organomegally, no rebound, no guarding Ext:  2 plus pulses, no edema, no cyanosis, no clubbing Skin:  No rashes no nodules Neuro:  CN II through XII intact, motor grossly intact  DEVICE  Normal device function.  See PaceArt for details.   Assess/Plan:

## 2013-08-20 NOTE — Interval H&P Note (Signed)
History and Physical Interval Note:  08/20/2013 7:07 AM  [name]  has presented today for surgery, with the diagnosis of eol  The various methods of treatment have been discussed with the patient and family. After consideration of risks, benefits and other options for treatment, the patient has consented to  Procedure(s): LEAD CAP OFF (N/A) and removal of his old ICD as a surgical intervention .  The patient's history has been reviewed, patient examined, no change in status, stable for surgery.  I have reviewed the patient's chart and labs.  Questions were answered to the patient's satisfaction.     Lewayne Bunting ,M.D.

## 2013-12-05 ENCOUNTER — Telehealth: Payer: Self-pay | Admitting: *Deleted

## 2013-12-05 MED ORDER — AMLODIPINE BESYLATE 5 MG PO TABS: 5.0000 mg | ORAL_TABLET | Freq: Every day | ORAL | Status: DC

## 2013-12-05 MED ORDER — SPIRONOLACTONE 25 MG PO TABS: 12.5000 mg | ORAL_TABLET | Freq: Every day | ORAL | Status: DC

## 2013-12-05 MED ORDER — DIGOXIN 125 MCG PO TABS: 125.0000 ug | ORAL_TABLET | Freq: Every day | ORAL | Status: DC

## 2013-12-05 MED ORDER — SIMVASTATIN 20 MG PO TABS: 20.0000 mg | ORAL_TABLET | Freq: Every day | ORAL | Status: DC

## 2013-12-05 MED ORDER — FUROSEMIDE 20 MG PO TABS: 20.0000 mg | ORAL_TABLET | ORAL | Status: DC

## 2013-12-05 MED ORDER — CARVEDILOL 25 MG PO TABS: 50.0000 mg | ORAL_TABLET | Freq: Two times a day (BID) | ORAL | Status: DC

## 2013-12-05 NOTE — Telephone Encounter (Signed)
Pt needs [facility]  furosimide spironalactone  Simvastatin Digoxin carvedilol Amlodipine  #90 on all

## 2013-12-05 NOTE — Telephone Encounter (Signed)
Refills completed

## 2014-01-01 ENCOUNTER — Encounter: Payer: Self-pay | Admitting: *Deleted

## 2014-02-12 ENCOUNTER — Encounter: Payer: Self-pay | Admitting: Internal Medicine

## 2014-08-28 ENCOUNTER — Other Ambulatory Visit: Payer: Self-pay | Admitting: Adult Health

## 2014-08-28 MED ORDER — SPIRONOLACTONE 25 MG PO TABS: 12.5000 mg | ORAL_TABLET | Freq: Every day | ORAL | Status: DC

## 2014-08-28 MED ORDER — CARVEDILOL 25 MG PO TABS: 50.0000 mg | ORAL_TABLET | Freq: Two times a day (BID) | ORAL | Status: DC

## 2014-08-28 MED ORDER — AMLODIPINE BESYLATE 5 MG PO TABS: 5.0000 mg | ORAL_TABLET | Freq: Every day | ORAL | Status: DC

## 2014-08-28 MED ORDER — DIGOXIN 125 MCG PO TABS: 125.0000 ug | ORAL_TABLET | Freq: Every day | ORAL | Status: DC

## 2014-08-28 MED ORDER — FUROSEMIDE 20 MG PO TABS: 20.0000 mg | ORAL_TABLET | ORAL | Status: DC

## 2014-08-28 NOTE — Telephone Encounter (Signed)
Needs refills on Amlodipine, Spironolactone, Furosemide, Digoxin, Losartan, Carvedilol sent to [facility]. / tgs

## 2014-09-10 ENCOUNTER — Telehealth: Payer: Self-pay | Admitting: *Deleted

## 2014-09-10 NOTE — Telephone Encounter (Signed)
Pt needing 90 day supply on medications for the year per cheaper for insurance. Pt will be seen on Thursday by Dr. Lovena Le and will refill medications at that time incase of changes. Pt has enough for the month. Pt understood.

## 2014-09-10 NOTE — Telephone Encounter (Signed)
Pt is calling because [facility] was telling him to before they can refill 7 RX

## 2014-09-12 ENCOUNTER — Ambulatory Visit (INDEPENDENT_AMBULATORY_CARE_PROVIDER_SITE_OTHER): Payer: Medicare Other | Admitting: Internal Medicine

## 2014-09-12 DIAGNOSIS — I10 Essential (primary) hypertension: Secondary | ICD-10-CM

## 2014-09-12 MED ORDER — CARVEDILOL 25 MG PO TABS: 50.0000 mg | ORAL_TABLET | Freq: Two times a day (BID) | ORAL | Status: DC

## 2014-09-12 MED ORDER — FUROSEMIDE 20 MG PO TABS: 20.0000 mg | ORAL_TABLET | ORAL | Status: DC

## 2014-09-12 MED ORDER — SPIRONOLACTONE 25 MG PO TABS: 12.5000 mg | ORAL_TABLET | Freq: Every day | ORAL | Status: DC

## 2014-09-12 MED ORDER — DIGOXIN 125 MCG PO TABS: 125.0000 ug | ORAL_TABLET | Freq: Every day | ORAL | Status: DC

## 2014-09-12 MED ORDER — SIMVASTATIN 20 MG PO TABS: 20.0000 mg | ORAL_TABLET | Freq: Every day | ORAL | Status: DC

## 2014-09-12 MED ORDER — AMLODIPINE BESYLATE 5 MG PO TABS: 5.0000 mg | ORAL_TABLET | Freq: Every day | ORAL | Status: DC

## 2014-09-12 NOTE — Progress Notes (Signed)
HPI [name] returns today for followup. He is a very pleasant 73 year old man with a nonischemic cardiomyopathy, chronic systolic heart failure, and atrial fibrillation. In the past, he has been on warfarin but now has been switched to Eliquis. No syncope. His ICD has been removed after his LV function normalized. He feels well. His blood pressure has been controlled after switching to Cozaar.     No Known Allergies   Current Outpatient Prescriptions  Medication Sig Dispense Refill  . amLODipine (NORVASC) 5 MG tablet Take 1 tablet (5 mg total) by mouth daily. 90 tablet 0  . apixaban (ELIQUIS) 5 MG TABS tablet Take 1 tablet (5 mg total) by mouth 2 (two) times daily. 60 tablet 3  . aspirin 325 MG tablet Take 325 mg by mouth daily.      . calcium citrate (CALCITRATE - DOSED IN MG ELEMENTAL CALCIUM) 950 MG tablet Take 1 tablet by mouth daily.      . carvedilol (COREG) 25 MG tablet Take 2 tablets (50 mg total) by mouth 2 (two) times daily with a meal. 60 tablet 0  . cholecalciferol (VITAMIN D) 1000 UNITS tablet Take 1,000 Units by mouth daily.    . digoxin (LANOXIN) 0.125 MG tablet Take 1 tablet (125 mcg total) by mouth daily. 90 tablet 0  . diphenhydramine-acetaminophen (TYLENOL PM) 25-500 MG TABS Take 1 tablet by mouth at bedtime as needed (sleep).    . furosemide (LASIX) 20 MG tablet Take 1 tablet (20 mg total) by mouth every other day. 45 tablet 0  . JANUVIA 100 MG tablet     . losartan (COZAAR) 50 MG tablet     . metFORMIN (GLUCOPHAGE) 500 MG tablet Take 1,000 mg by mouth daily after supper.     . Multiple Vitamins-Minerals (MULTIVITAMIN WITH MINERALS) tablet Take 1 tablet by mouth daily.      . simvastatin (ZOCOR) 20 MG tablet Take 1 tablet (20 mg total) by mouth at bedtime. 90 tablet 3  . spironolactone (ALDACTONE) 25 MG tablet Take 0.5 tablets (12.5 mg total) by mouth daily. 45 tablet 0   No current facility-administered medications for this visit.     Past Medical History   Diagnosis Date  . Atrial fibrillation     2006: epistaxis, UGI bleed and renal hematoma on coumadin->partial nephrectomy; subsequently has refused anticoagulation  . Non-ischemic cardiomyopathy      EF-35% in 2005, 40-45% in 2009; AICD in 2006  . Arteriosclerotic cardiovascular disease (ASCVD)      non-critical with LM, LAD, RCA <50%; 75% Cx in a small vessel  . Hypertension 2006    AUTOMATIC IMPLANTABLE CARDIAC DEFIBRILLATOR -  . Tobacco abuse, in remission 1995    30 pk-yrs; D/C in 1995  . Diabetes mellitus type II     Borderline  . Chronic kidney disease     +ARF, but creat. nl in 2009; 0.87 in 08/2011  . Hyperlipidemia   . Colon polyps   . Psoriasis     ROS:   All systems reviewed and negative except as noted in the HPI.   Past Surgical History  Procedure Laterality Date  . Cardiac defibrillator placement      Dr. Lovena Le 08/2005.  [employer]  . Partial nephrectomy    . Colonoscopy  2012    Negative screening study; remote history of polypectomy     No family history on file.   History   Social History  . Marital Status: Married    Spouse Name: N/A  Number of Children: N/A  . Years of Education: N/A   Occupational History  . Retired     Personnel officer   Social History Main Topics  . Smoking status: Former Smoker -- 1.50 packs/day for 40 years    Quit date: 10/11/1993  . Smokeless tobacco: Never Used  . Alcohol Use: 0.5 oz/week    1 drink(s) per week  . Drug Use: No  . Sexual Activity: Not on file   Other Topics Concern  . Not on file   Social History Narrative  . No narrative on file     BP 122/70 mmHg  Pulse 83  Ht 5\' 11"  (1.803 m)  Wt 239 lb (108.41 kg)  BMI 33.35 kg/m2  Physical Exam:  Well appearing 73 year old man, NAD HEENT: Unremarkable Neck:  No JVD, no thyromegally Lungs:  Clear with no wheezes, rales, or rhonchi. HEART:  Regular rate rhythm, no murmurs, no rubs, no clicks Abd:  soft, positive bowel sounds, no  organomegally, no rebound, no guarding Ext:  2 plus pulses, no edema, no cyanosis, no clubbing Skin:  No rashes no nodules Neuro:  CN II through XII intact, motor grossly intact   Assess/Plan:

## 2014-09-12 NOTE — Assessment & Plan Note (Signed)
He is encouraged to lose weight. He is encouraged to remain active. He will continue his statin therapy.

## 2014-09-12 NOTE — Assessment & Plan Note (Signed)
His blood pressure remains well controlled. He will continue his current meds.

## 2014-09-12 NOTE — Assessment & Plan Note (Signed)
He is tolerating Eliquis very nicely. He will continue a strategy of rate control.

## 2014-09-12 NOTE — Patient Instructions (Signed)
Your physician wants you to follow-up in: 1 year with Dr. Knox Saliva will receive a reminder letter in the mail two months in advance. If you don't receive a letter, please call our office to schedule the follow-up appointment.  Your physician recommends that you continue on your current medications as directed. Please refer to the Current Medication list given to you today.  I HAVE REFILLED YOUR MEDICATIONS FOR 90 DAY SUPPLY 1 YEAR.   Thank you for choosing [facility]!!

## 2014-09-18 ENCOUNTER — Encounter: Payer: Self-pay | Admitting: Internal Medicine

## 2014-09-19 ENCOUNTER — Encounter (HOSPITAL_COMMUNITY): Payer: Self-pay | Admitting: Internal Medicine

## 2014-09-26 ENCOUNTER — Other Ambulatory Visit: Payer: Self-pay | Admitting: *Deleted

## 2014-09-26 MED ORDER — LOSARTAN POTASSIUM 50 MG PO TABS: 50.0000 mg | ORAL_TABLET | Freq: Every day | ORAL | Status: DC

## 2014-09-26 NOTE — Telephone Encounter (Signed)
PT NEEDS LOSARTIN CALLED IN ASAP HE IS RUNNING LOW AND USES A MAIL ORDER

## 2014-09-26 NOTE — Telephone Encounter (Signed)
Refill complete 

## 2015-07-08 ENCOUNTER — Other Ambulatory Visit: Payer: Self-pay | Admitting: Internal Medicine

## 2015-07-08 MED ORDER — CARVEDILOL 25 MG PO TABS: 50.0000 mg | ORAL_TABLET | Freq: Two times a day (BID) | ORAL | Status: DC

## 2015-07-08 NOTE — Telephone Encounter (Signed)
Patient called to request Carvedilol refill. I asked him if he called [facility] and he said they're requiring we send them a new Rx. He requested a 90 day supply.   Patient is scheduled to see Dr. Lovena Le for his 1 yr f/o on 09/29/15.

## 2015-07-08 NOTE — Telephone Encounter (Signed)
Refill for 90 days complete

## 2015-09-07 ENCOUNTER — Other Ambulatory Visit: Payer: Self-pay | Admitting: Internal Medicine

## 2015-09-29 ENCOUNTER — Encounter: Payer: Self-pay | Admitting: Internal Medicine

## 2015-09-29 ENCOUNTER — Ambulatory Visit (INDEPENDENT_AMBULATORY_CARE_PROVIDER_SITE_OTHER): Payer: Medicare Other | Admitting: Internal Medicine

## 2015-09-29 DIAGNOSIS — I428 Other cardiomyopathies: Secondary | ICD-10-CM

## 2015-09-29 DIAGNOSIS — I482 Chronic atrial fibrillation, unspecified: Secondary | ICD-10-CM

## 2015-09-29 DIAGNOSIS — I429 Cardiomyopathy, unspecified: Secondary | ICD-10-CM | POA: Diagnosis not present

## 2015-09-29 MED ORDER — SPIRONOLACTONE 25 MG PO TABS: 12.5000 mg | ORAL_TABLET | Freq: Every day | ORAL | Status: DC

## 2015-09-29 MED ORDER — LOSARTAN POTASSIUM 50 MG PO TABS: ORAL_TABLET | ORAL | Status: DC

## 2015-09-29 MED ORDER — AMLODIPINE BESYLATE 5 MG PO TABS: ORAL_TABLET | ORAL | Status: DC

## 2015-09-29 MED ORDER — FUROSEMIDE 20 MG PO TABS: 20.0000 mg | ORAL_TABLET | ORAL | Status: DC

## 2015-09-29 MED ORDER — SIMVASTATIN 20 MG PO TABS: ORAL_TABLET | ORAL | Status: DC

## 2015-09-29 NOTE — Assessment & Plan Note (Signed)
He has had normalization of his LV function and his heart failure symptoms remain class 1. No change in meds.

## 2015-09-29 NOTE — Patient Instructions (Signed)
Your physician wants you to follow-up in: 1 Year with Dr. Lovena Le. You will receive a reminder letter in the mail two months in advance. If you don't receive a letter, please call our office to schedule the follow-up appointment.  Your physician has recommended you make the following change in your medication:   Take Losartan 1 Tablet on the Days That you do not take your Lasix   If you need a refill on your cardiac medications before your next appointment, please call your pharmacy.  Thank you for choosing [facility]!

## 2015-09-29 NOTE — Progress Notes (Signed)
HPI Mr. Bounds returns today for followup. He is a very pleasant 74 year old man with a nonischemic cardiomyopathy, chronic systolic heart failure, and atrial fibrillation. In the past, he has been on warfarin but now has been switched to Eliquis but then developed additional GI bleeding and is on no blood thinners. No syncope. His ICD has been removed after his LV function normalized. He feels well. His blood pressure has been controlled though he has switched from cozaar to valsartan. He is exercising and has joined [employer].     No Known Allergies   Current Outpatient Prescriptions  Medication Sig Dispense Refill  . amLODipine (NORVASC) 5 MG tablet Take 1 tablet by mouth  daily 90 tablet 0  . aspirin 325 MG tablet Take 325 mg by mouth daily.      . carvedilol (COREG) 25 MG tablet Take 2 tablets (50 mg total) by mouth 2 (two) times daily with a meal. 360 tablet 3  . DIGOX 125 MCG tablet Take 1 tablet by mouth  daily 90 tablet 0  . diphenhydramine-acetaminophen (TYLENOL PM) 25-500 MG TABS Take 1 tablet by mouth at bedtime as needed (sleep).    . furosemide (LASIX) 20 MG tablet Take 1 tablet (20 mg total) by mouth every other day. 135 tablet 3  . losartan (COZAAR) 50 MG tablet Take 1 tablet by mouth  daily 90 tablet 0  . metFORMIN (GLUCOPHAGE) 500 MG tablet Take 1,000 mg by mouth daily after supper.     . Multiple Vitamins-Minerals (MULTIVITAMIN WITH MINERALS) tablet Take 1 tablet by mouth daily.      . simvastatin (ZOCOR) 20 MG tablet Take 1 tablet by mouth at  bedtime 90 tablet 0  . spironolactone (ALDACTONE) 25 MG tablet Take 0.5 tablets (12.5 mg total) by mouth daily. 135 tablet 3   No current facility-administered medications for this visit.     Past Medical History  Diagnosis Date  . Atrial fibrillation ([place])     2006: epistaxis, UGI bleed and renal hematoma on coumadin->partial nephrectomy; subsequently has refused anticoagulation  . Non-ischemic cardiomyopathy ([place])     EF-35% in 2005, 40-45% in 2009; AICD in 2006  . Arteriosclerotic cardiovascular disease (ASCVD)      non-critical with LM, LAD, RCA <50%; 75% Cx in a small vessel  . Hypertension 2006    AUTOMATIC IMPLANTABLE CARDIAC DEFIBRILLATOR -  . Tobacco abuse, in remission 1995    30 pk-yrs; D/C in 1995  . Diabetes mellitus type II     Borderline  . Chronic kidney disease     +ARF, but creat. nl in 2009; 0.87 in 08/2011  . Hyperlipidemia   . Colon polyps   . Psoriasis     ROS:   All systems reviewed and negative except as noted in the HPI.   Past Surgical History  Procedure Laterality Date  . Cardiac defibrillator placement      Dr. Lovena Le 08/2005.  [employer]  . Partial nephrectomy    . Colonoscopy  2012    Negative screening study; remote history of polypectomy  . Generator removal N/A 08/20/2013    Procedure: LEAD CAP OFF;  Surgeon: Evans Lance, MD;  Location: [facility] CATH LAB;  Service: Cardiovascular;  Laterality: N/A;     No family history on file.   Social History   Social History  . Marital Status: Married    Spouse Name: N/A  . Number of Children: N/A  . Years of Education: N/A   Occupational History  .  Retired     Personnel officer   Social History Main Topics  . Smoking status: Former Smoker -- 1.50 packs/day for 40 years    Quit date: 10/11/1993  . Smokeless tobacco: Never Used  . Alcohol Use: 0.5 oz/week    1 drink(s) per week  . Drug Use: No  . Sexual Activity: Not on file   Other Topics Concern  . Not on file   Social History Narrative     BP 110/70 mmHg  Pulse 94  Ht 5\' 11"  (1.803 m)  Wt 236 lb (107.049 kg)  BMI 32.93 kg/m2  SpO2 96%  Physical Exam:  Well appearing 74 year old man, NAD HEENT: Unremarkable Neck:  No JVD, no thyromegally Lungs:  Clear with no wheezes, rales, or rhonchi. HEART:  IRegular rate rhythm, no murmurs, no rubs, no clicks Abd:  soft, positive bowel sounds, no organomegally, no rebound, no guarding Ext:  2 plus  pulses, no edema, no cyanosis, no clubbing Skin:  No rashes no nodules Neuro:  CN II through XII intact, motor grossly intact  ECG - atrial fib with a controlled VR  Assess/Plan:

## 2015-09-29 NOTE — Assessment & Plan Note (Signed)
He has had a controlled VR. He is not a candidate for systemic anti-coagulation. He will continue a strategy of rate control.

## 2015-09-29 NOTE — Assessment & Plan Note (Signed)
His blood pressure is well controlled. No change in meds.  

## 2016-01-23 ENCOUNTER — Other Ambulatory Visit: Payer: Self-pay | Admitting: Internal Medicine

## 2016-04-05 ENCOUNTER — Other Ambulatory Visit: Payer: Self-pay | Admitting: Internal Medicine

## 2016-09-24 ENCOUNTER — Encounter: Payer: Self-pay | Admitting: Internal Medicine

## 2016-09-24 ENCOUNTER — Ambulatory Visit (INDEPENDENT_AMBULATORY_CARE_PROVIDER_SITE_OTHER): Payer: Medicare Other | Admitting: Internal Medicine

## 2016-09-24 VITALS — BP 138/88 | HR 101 | Ht 71.0 in | Wt 235.0 lb

## 2016-09-24 DIAGNOSIS — I4891 Unspecified atrial fibrillation: Secondary | ICD-10-CM | POA: Diagnosis not present

## 2016-09-24 DIAGNOSIS — I428 Other cardiomyopathies: Secondary | ICD-10-CM

## 2016-09-24 MED ORDER — DIGOXIN 125 MCG PO TABS: 125.0000 ug | ORAL_TABLET | Freq: Every day | ORAL | 3 refills | Status: DC

## 2016-09-24 MED ORDER — CARVEDILOL 25 MG PO TABS: ORAL_TABLET | ORAL | 3 refills | Status: DC

## 2016-09-24 MED ORDER — FUROSEMIDE 20 MG PO TABS: 20.0000 mg | ORAL_TABLET | ORAL | 3 refills | Status: DC

## 2016-09-24 MED ORDER — SPIRONOLACTONE 25 MG PO TABS: 12.5000 mg | ORAL_TABLET | Freq: Every day | ORAL | 3 refills | Status: DC

## 2016-09-24 MED ORDER — ATORVASTATIN CALCIUM 20 MG PO TABS: 20.0000 mg | ORAL_TABLET | Freq: Every day | ORAL | 3 refills | Status: DC

## 2016-09-24 MED ORDER — AMLODIPINE BESYLATE 5 MG PO TABS: ORAL_TABLET | ORAL | 3 refills | Status: DC

## 2016-09-24 NOTE — Progress Notes (Signed)
HPI Mr. Doswell returns today for followup. He is a very pleasant 75 year old man with a nonischemic cardiomyopathy, chronic systolic heart failure, and atrial fibrillation. In the past, he has been on warfarin but now has been switched to Eliquis but then developed additional GI bleeding and is on no blood thinners. No syncope. His ICD has been removed after his LV function normalized. He feels well. His blood pressure has been controlled though he has switched from cozaar to valsartan. He is exercising and works restoring tractors. He notes occaisional episodes of dyspnea. No falls or bleeding.    No Known Allergies   Current Outpatient Prescriptions  Medication Sig Dispense Refill  . amLODipine (NORVASC) 5 MG tablet Take 1 tablet by mouth  daily 90 tablet 3  . aspirin 325 MG tablet Take 325 mg by mouth daily.      Marland Kitchen atorvastatin (LIPITOR) 20 MG tablet Take 20 mg by mouth daily.    . carvedilol (COREG) 25 MG tablet Take 2 tablets by mouth two times daily with meals 360 tablet 1  . DIGOX 125 MCG tablet Take 1 tablet by mouth  daily 90 tablet 3  . furosemide (LASIX) 20 MG tablet Take 1 tablet (20 mg total) by mouth every other day. 135 tablet 3  . metFORMIN (GLUCOPHAGE) 500 MG tablet Take 1,000 mg by mouth daily after supper.     . Multiple Vitamins-Minerals (MULTIVITAMIN WITH MINERALS) tablet Take 1 tablet by mouth daily.      Marland Kitchen spironolactone (ALDACTONE) 25 MG tablet Take 0.5 tablets (12.5 mg total) by mouth daily. 135 tablet 3  . valsartan (DIOVAN) 320 MG tablet      No current facility-administered medications for this visit.      Past Medical History:  Diagnosis Date  . Arteriosclerotic cardiovascular disease (ASCVD)     non-critical with LM, LAD, RCA <50%; 75% Cx in a small vessel  . Atrial fibrillation ([place])    2006: epistaxis, UGI bleed and renal hematoma on coumadin->partial nephrectomy; subsequently has refused anticoagulation  . Chronic kidney disease    +ARF, but creat.  nl in 2009; 0.87 in 08/2011  . Colon polyps   . Diabetes mellitus type II    Borderline  . Hyperlipidemia   . Hypertension 2006   AUTOMATIC IMPLANTABLE CARDIAC DEFIBRILLATOR -  . Non-ischemic cardiomyopathy ([place])     EF-35% in 2005, 40-45% in 2009; AICD in 2006  . Psoriasis   . Tobacco abuse, in remission 1995   30 pk-yrs; D/C in 1995    ROS:   All systems reviewed and negative except as noted in the HPI.   Past Surgical History:  Procedure Laterality Date  . CARDIAC DEFIBRILLATOR PLACEMENT     Dr. Lovena Le 08/2005.  [employer]  . COLONOSCOPY  2012   Negative screening study; remote history of polypectomy  . GENERATOR REMOVAL N/A 08/20/2013   Procedure: LEAD CAP OFF;  Surgeon: Evans Lance, MD;  Location: [facility] CATH LAB;  Service: Cardiovascular;  Laterality: N/A;  . PARTIAL NEPHRECTOMY       No family history on file.   Social History   Social History  . Marital status: Married    Spouse name: N/A  . Number of children: N/A  . Years of education: N/A   Occupational History  . Retired     Personnel officer   Social History Main Topics  . Smoking status: Former Smoker    Packs/day: 1.50    Years: 40.00    Quit  date: 10/11/1993  . Smokeless tobacco: Never Used  . Alcohol use 0.5 oz/week    1 drink(s) per week  . Drug use: No  . Sexual activity: Not on file   Other Topics Concern  . Not on file   Social History Narrative  . No narrative on file     BP 138/88   Pulse (!) 101   Ht 5\' 11"  (1.803 m)   Wt 235 lb (106.6 kg)   SpO2 92%   BMI 32.78 kg/m   Physical Exam:  Well appearing 75 year old man, NAD HEENT: Unremarkable Neck:  No JVD, no thyromegally Lungs:  Clear with no wheezes, rales, or rhonchi. HEART:  IRegular rate rhythm, no murmurs, no rubs, no clicks Abd:  soft, positive bowel sounds, no organomegally, no rebound, no guarding Ext:  2 plus pulses, no edema, no cyanosis, no clubbing Skin:  No rashes no nodules Neuro:  CN II through XII  intact, motor grossly intact  ECG - atrial fib with a controlled VR  Assess/Plan: 1. Atrial fib - his rate is reasonably well controlled. He will continue his current medications.  2. H/o Gi bleeding - he has had none but I do not think he is a candidate for anti-coagulation 3. Chronic systolic heart failure - he had normalization of his LV function. No indication for another ICD at this time. Will follow.  Mikle Bosworth.D.

## 2016-09-24 NOTE — Patient Instructions (Signed)
Your physician wants you to follow-up in: 1 Year with Dr. Taylor. You will receive a reminder letter in the mail two months in advance. If you don't receive a letter, please call our office to schedule the follow-up appointment.  Your physician recommends that you continue on your current medications as directed. Please refer to the Current Medication list given to you today.  If you need a refill on your cardiac medications before your next appointment, please call your pharmacy.  Thank you for choosing [facility]!    

## 2016-11-08 ENCOUNTER — Inpatient Hospital Stay: Admission: AD | Admit: 2016-11-08 | Payer: Self-pay | Source: Other Acute Inpatient Hospital | Admitting: Neurosurgery

## 2016-12-30 ENCOUNTER — Encounter: Payer: Self-pay | Admitting: *Deleted

## 2017-01-10 NOTE — Progress Notes (Deleted)
Electrophysiology Office Note Date: 01/10/2017  ID:  Ruben, [name] 1941-04-17, MRN [id number]  PCP: Normand Sloop, MD Electrophysiologist: Lovena Le  CC: post hospital follow up  [name] is a 76 y.o. male seen today for ***.  {He/she (caps):30048} presents today for routine electrophysiology followup.  Since last being seen in our clinic, the patient reports doing very well.  {He/she (caps):30048} denies chest pain, palpitations, dyspnea, PND, orthopnea, nausea, vomiting, dizziness, syncope, edema, weight gain, or early satiety.  Past Medical History:  Diagnosis Date  . Arteriosclerotic cardiovascular disease (ASCVD)     non-critical with LM, LAD, RCA <50%; 75% Cx in a small vessel  . Atrial fibrillation ([place])    2006: epistaxis, UGI bleed and renal hematoma on coumadin->partial nephrectomy; subsequently has refused anticoagulation  . Chronic kidney disease    +ARF, but creat. nl in 2009; 0.87 in 08/2011  . Colon polyps   . Diabetes mellitus type II    Borderline  . Hyperlipidemia   . Hypertension 2006   AUTOMATIC IMPLANTABLE CARDIAC DEFIBRILLATOR -  . Non-ischemic cardiomyopathy ([place])     EF-35% in 2005, 40-45% in 2009; AICD in 2006  . Psoriasis   . Tobacco abuse, in remission 1995   30 pk-yrs; D/C in 1995   Past Surgical History:  Procedure Laterality Date  . CARDIAC DEFIBRILLATOR PLACEMENT     Dr. Lovena Le 08/2005.  [employer]  . COLONOSCOPY  2012   Negative screening study; remote history of polypectomy  . GENERATOR REMOVAL N/A 08/20/2013   Procedure: LEAD CAP OFF;  Surgeon: Evans Lance, MD;  Location: [facility] CATH LAB;  Service: Cardiovascular;  Laterality: N/A;  . PARTIAL NEPHRECTOMY      Current Outpatient Prescriptions  Medication Sig Dispense Refill  . amLODipine (NORVASC) 5 MG tablet Take 1 tablet by mouth  daily 90 tablet 3  . aspirin 325 MG tablet Take 325 mg by mouth daily.      Marland Kitchen atorvastatin (LIPITOR) 20 MG tablet Take 1 tablet (20 mg  total) by mouth daily. 90 tablet 3  . carvedilol (COREG) 25 MG tablet Take 2 tablets by mouth two times daily with meals 360 tablet 3  . digoxin (DIGOX) 0.125 MG tablet Take 1 tablet (125 mcg total) by mouth daily. 90 tablet 3  . furosemide (LASIX) 20 MG tablet Take 1 tablet (20 mg total) by mouth every other day. 45 tablet 3  . metFORMIN (GLUCOPHAGE) 500 MG tablet Take 1,000 mg by mouth daily after supper.     . Multiple Vitamins-Minerals (MULTIVITAMIN WITH MINERALS) tablet Take 1 tablet by mouth daily.      Marland Kitchen spironolactone (ALDACTONE) 25 MG tablet Take 0.5 tablets (12.5 mg total) by mouth daily. 45 tablet 3  . valsartan (DIOVAN) 320 MG tablet      No current facility-administered medications for this visit.     Allergies:   Patient has no known allergies.   Social History: Social History   Social History  . Marital status: Married    Spouse name: N/A  . Number of children: N/A  . Years of education: N/A   Occupational History  . Retired     Personnel officer   Social History Main Topics  . Smoking status: Former Smoker    Packs/day: 1.50    Years: 40.00    Quit date: 10/11/1993  . Smokeless tobacco: Never Used  . Alcohol use 0.5 oz/week    1 drink(s) per week  . Drug use:  No  . Sexual activity: Not on file   Other Topics Concern  . Not on file   Social History Narrative  . No narrative on file    Family History: No family history on file.  Review of Systems: General: No chills, fever, night sweats or weight changes  Cardiovascular:  No chest pain, dyspnea on exertion, edema, orthopnea, palpitations, paroxysmal nocturnal dyspnea  Dermatological: No rash, lesions or masses Respiratory: No cough, dyspnea Urologic: No hematuria, dysuria Abdominal: No nausea, vomiting, diarrhea, bright red blood per rectum, melena, or hematemesis Neurologic: No visual changes, weakness, changes in mental status All other systems reviewed and are otherwise negative except as noted  above.   Physical Exam: VS:  There were no vitals taken for this visit. , BMI There is no height or weight on file to calculate BMI. Wt Readings from Last 3 Encounters:  09/24/16 235 lb (106.6 kg)  09/29/15 236 lb (107 kg)  09/12/14 239 lb (108.4 kg)    GEN- The patient is well appearing, alert and oriented x 3 today.   HEENT: normocephalic, atraumatic; sclera clear, conjunctiva pink; hearing intact; oropharynx clear; neck supple, no JVP Lymph- no cervical lymphadenopathy Lungs- Clear to ausculation bilaterally, normal work of breathing.  No wheezes, rales, rhonchi Heart- Regular rate and rhythm, no murmurs, rubs or gallops, PMI not laterally displaced GI- soft, non-tender, non-distended, bowel sounds present, no hepatosplenomegaly Extremities- no clubbing, cyanosis, or edema; DP/PT/radial pulses 2+ bilaterally MS- no significant deformity or atrophy Skin- warm and dry, no rash or lesion  Psych- euthymic mood, full affect Neuro- strength and sensation are intact   EKG:  EKG {ACTION; IS/IS PVV:[id number]} ordered today. The ekg ordered today shows ***  Recent Labs: No results found for requested labs within last 8760 hours.    Other studies Reviewed: Additional studies/ records that were reviewed today include: ***  Review of the above records today demonstrates: ***  Assessment and Plan:  ***   Current medicines are reviewed at length with the patient today.   The patient {ACTIONS; HAS/DOES NOT HAVE:19233} concerns regarding his medicines.  The following changes were made today:  {NONE DEFAULTED:18576::"none"}  Labs/ tests ordered today include: *** No orders of the defined types were placed in this encounter.    Disposition:   Follow up with *** {gen number 6-75:449201} {TIME; UNITS DAY/WEEK/MONTH:19136}   Signed, Chanetta Marshall, NP 01/10/2017 10:21 AM   [facility] [address] [place] [address] [phone] (office) [phone]  (fax

## 2017-01-12 ENCOUNTER — Ambulatory Visit: Payer: Medicare Other | Admitting: Nurse Practitioner

## 2017-01-14 ENCOUNTER — Encounter: Payer: Self-pay | Admitting: Nurse Practitioner

## 2017-02-14 ENCOUNTER — Encounter: Payer: Self-pay | Admitting: Internal Medicine

## 2017-02-14 ENCOUNTER — Ambulatory Visit: Payer: Medicare Other | Admitting: Internal Medicine

## 2017-02-24 ENCOUNTER — Encounter: Payer: Self-pay | Admitting: Internal Medicine

## 2017-02-24 ENCOUNTER — Ambulatory Visit (INDEPENDENT_AMBULATORY_CARE_PROVIDER_SITE_OTHER): Payer: Medicare Other | Admitting: Internal Medicine

## 2017-02-24 VITALS — BP 122/69 | HR 60 | Ht 71.0 in | Wt 232.0 lb

## 2017-02-24 DIAGNOSIS — I69359 Hemiplegia and hemiparesis following cerebral infarction affecting unspecified side: Secondary | ICD-10-CM | POA: Diagnosis not present

## 2017-02-24 DIAGNOSIS — I482 Chronic atrial fibrillation, unspecified: Secondary | ICD-10-CM

## 2017-02-24 DIAGNOSIS — I428 Other cardiomyopathies: Secondary | ICD-10-CM

## 2017-02-24 NOTE — Patient Instructions (Signed)
Your physician wants you to follow-up in: 6 Months with Dr. Taylor. You will receive a reminder letter in the mail two months in advance. If you don't receive a letter, please call our office to schedule the follow-up appointment.  Your physician recommends that you continue on your current medications as directed. Please refer to the Current Medication list given to you today.  If you need a refill on your cardiac medications before your next appointment, please call your pharmacy.  Thank you for choosing [facility]!   

## 2017-02-24 NOTE — Progress Notes (Signed)
HPI Mr. Down returns today for followup. He is a very pleasant 76 year old man with a nonischemic cardiomyopathy, chronic systolic heart failure, and atrial fibrillation. In the past, he has been on warfarin but now has been switched to Eliquis but then developed additional GI bleeding and is on no blood thinners. Since I saw him last, he has sustained a stroke. 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 He is on ASA alone. The patient denies worsening CHF symptoms. His EF had normalized previously and while he was in the hospital in [place], had a couple of MRI's.  Allergies - NKDA   Current Outpatient Prescriptions  Medication Sig Dispense Refill  . amLODipine (NORVASC) 5 MG tablet Take 1 tablet by mouth  daily 90 tablet 3  . aspirin 325 MG tablet Take 325 mg by mouth daily.      Marland Kitchen atorvastatin (LIPITOR) 20 MG tablet Take 1 tablet (20 mg total) by mouth daily. 90 tablet 3  . carvedilol (COREG) 25 MG tablet Take 2 tablets by mouth two times daily with meals 360 tablet 3  . digoxin (DIGOX) 0.125 MG tablet Take 1 tablet (125 mcg total) by mouth daily. 90 tablet 3  . furosemide (LASIX) 20 MG tablet Take 1 tablet (20 mg total) by mouth every other day. 45 tablet 3  . glipiZIDE (GLUCOTROL) 5 MG tablet Take 5 mg by mouth daily before breakfast.     . metFORMIN (GLUCOPHAGE) 500 MG tablet Take 1,000 mg by mouth daily after supper.     . Multiple Vitamins-Minerals (MULTIVITAMIN WITH MINERALS) tablet Take 1 tablet by mouth daily.      Marland Kitchen spironolactone (ALDACTONE) 25 MG tablet Take 12.5 mg by mouth every other day.    . valsartan (DIOVAN) 320 MG tablet Take 320 mg by mouth daily.      No current facility-administered medications for this visit.      Past Medical History:  Diagnosis Date  . Arteriosclerotic cardiovascular disease (ASCVD)     non-critical with LM, LAD, RCA <50%; 75% Cx in a small vessel  . Atrial fibrillation ([place])    2006: epistaxis, UGI bleed and renal hematoma on  coumadin->partial nephrectomy; subsequently has refused anticoagulation  . Chronic kidney disease    +ARF, but creat. nl in 2009; 0.87 in 08/2011  . Colon polyps   . Diabetes mellitus type II    Borderline  . Hyperlipidemia   . Hypertension 2006   AUTOMATIC IMPLANTABLE CARDIAC DEFIBRILLATOR -  . Non-ischemic cardiomyopathy ([place])     EF-35% in 2005, 40-45% in 2009; AICD in 2006  . Psoriasis   . Tobacco abuse, in remission 1995   30 pk-yrs; D/C in 1995    ROS:   All systems reviewed and negative except as noted in the HPI.   Past Surgical History:  Procedure Laterality Date  . CARDIAC DEFIBRILLATOR PLACEMENT     Dr. Lovena Le 08/2005.  [employer]  . COLONOSCOPY  2012   Negative screening study; remote history of polypectomy  . GENERATOR REMOVAL N/A 08/20/2013   Procedure: LEAD CAP OFF;  Surgeon: Evans Lance, MD;  Location: [facility] CATH LAB;  Service: Cardiovascular;  Laterality: N/A;  . PARTIAL NEPHRECTOMY       History reviewed. No pertinent family history.   Social History   Social History  . Marital status: Married    Spouse name: N/A  . Number of children: N/A  . Years of education: N/A   Occupational History  . 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 Smoking status: Former Smoker    Packs/day: 1.50    Years: 40.00    Quit date: 10/11/1993  . Smokeless tobacco: Never Used  . Alcohol use 0.5 oz/week    1 drink(s) per week  . Drug use: No  . Sexual activity: Not on file   Other Topics Concern  . Not on file   Social History Narrative  . No narrative on file     BP 122/69   Pulse 60   Ht 5\' 11"  (1.803 m)   Wt 232 lb (105.2 kg)   SpO2 95%   BMI 32.36 kg/m   Physical Exam:  Well appearing 76 year old man, NAD HEENT: Unremarkable Neck:  6 cm JVD, no thyromegally Lungs:  Clear with no wheezes, rales, or rhonchi. HEART:  IRegular rate rhythm, no murmurs, no rubs, no clicks Abd:  soft, positive bowel sounds, no organomegally, no  rebound, no guarding Ext:  2 plus pulses, no edema, no cyanosis, no clubbing Skin:  No rashes no nodules Neuro:  CN II through XII intact, motor grossly intact  ECG - atrial fib with a controlled VR  Assess/Plan: 1. Atrial fib - his rate is reasonably well controlled. He will continue his current medications. He is not a candidate for any anti-coagulation due to 2 life threatening bleeds. 2. H/o Gi bleeding - he has had none since coming off of the anti-coagulation. 3. Chronic systolic heart failure - he had normalization of his LV function. No indication for another ICD at this time. Will follow. 4. HTN heart disease- his blood pressure is well controlled. He will continue his current meds.  Mikle Bosworth.D.

## 2017-09-19 ENCOUNTER — Other Ambulatory Visit: Payer: Self-pay | Admitting: Internal Medicine

## 2017-09-19 DIAGNOSIS — I428 Other cardiomyopathies: Secondary | ICD-10-CM

## 2017-09-21 ENCOUNTER — Other Ambulatory Visit: Payer: Self-pay

## 2017-09-21 DIAGNOSIS — I428 Other cardiomyopathies: Secondary | ICD-10-CM

## 2017-09-21 MED ORDER — DIGOXIN 125 MCG PO TABS: 125.0000 ug | ORAL_TABLET | Freq: Every day | ORAL | 3 refills | Status: DC

## 2017-09-21 MED ORDER — AMLODIPINE BESYLATE 5 MG PO TABS: 5.0000 mg | ORAL_TABLET | Freq: Every day | ORAL | 3 refills | Status: DC

## 2017-09-21 MED ORDER — ATORVASTATIN CALCIUM 20 MG PO TABS: 20.0000 mg | ORAL_TABLET | Freq: Every day | ORAL | 3 refills | Status: DC

## 2017-09-26 ENCOUNTER — Ambulatory Visit (INDEPENDENT_AMBULATORY_CARE_PROVIDER_SITE_OTHER): Payer: Medicare Other | Admitting: Internal Medicine

## 2017-09-26 ENCOUNTER — Encounter: Payer: Self-pay | Admitting: Internal Medicine

## 2017-09-26 VITALS — BP 128/76 | HR 88 | Ht 70.0 in | Wt 236.0 lb

## 2017-09-26 DIAGNOSIS — I4891 Unspecified atrial fibrillation: Secondary | ICD-10-CM | POA: Diagnosis not present

## 2017-09-26 DIAGNOSIS — M25511 Pain in right shoulder: Secondary | ICD-10-CM | POA: Diagnosis not present

## 2017-09-26 NOTE — Progress Notes (Signed)
HPI [name] returns today for follow-up.  He is a very pleasant 76 year old man with a nonischemic cardiomyopathy, chronic systolic heart failure, status post ICD implantation.  He had normalization of his left ventricular function and his ICD was removed.  His ICD lead remains.  He is status post remote stroke involving the left brain.  He is on good medical therapy.  In the interim, he has done well, and notes that he has repaired 5 tractors this fall.  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  He complains of weakness and reduced range of motion in his right arm and shoulder.  He cannot abduct his right shoulder more than 90 degrees.  He also complains of tingling and weakness in his right hand. No Known Allergies   Current Outpatient Medications  Medication Sig Dispense Refill  . amLODipine (NORVASC) 5 MG tablet Take 1 tablet (5 mg total) by mouth daily. 90 tablet 3  . aspirin 325 MG tablet Take 325 mg by mouth daily.      Marland Kitchen atorvastatin (LIPITOR) 20 MG tablet Take 1 tablet (20 mg total) by mouth daily. 90 tablet 3  . carvedilol (COREG) 25 MG tablet TAKE 2 TABLETS BY MOUTH TWO TIMES DAILY WITH MEALS 360 tablet 3  . digoxin (LANOXIN) 0.125 MG tablet Take 1 tablet (125 mcg total) by mouth daily. 90 tablet 3  . furosemide (LASIX) 20 MG tablet TAKE 1 TABLET BY MOUTH  EVERY OTHER DAY 45 tablet 3  . glipiZIDE (GLUCOTROL) 5 MG tablet Take 5 mg by mouth daily before breakfast.     . losartan (COZAAR) 100 MG tablet Take 100 mg by mouth daily.    . metFORMIN (GLUCOPHAGE) 500 MG tablet Take 1,000 mg by mouth as directed. 500 mg am, 1000 mg at dinner    . metFORMIN (GLUCOPHAGE) 500 MG tablet Take 500 mg by mouth as directed. 500 mg am, and 1000 mg at dinner    . Multiple Vitamins-Minerals (MULTIVITAMIN WITH MINERALS) tablet Take 1 tablet by mouth daily.      Marland Kitchen spironolactone (ALDACTONE) 25 MG tablet Take 12.5 mg by mouth every other day.    . spironolactone (ALDACTONE) 25 MG tablet Take 25 mg by  mouth every other day.     No current facility-administered medications for this visit.      Past Medical History:  Diagnosis Date  . Arteriosclerotic cardiovascular disease (ASCVD)     non-critical with LM, LAD, RCA <50%; 75% Cx in a small vessel  . Atrial fibrillation ([place])    2006: epistaxis, UGI bleed and renal hematoma on coumadin->partial nephrectomy; subsequently has refused anticoagulation  . Chronic kidney disease    +ARF, but creat. nl in 2009; 0.87 in 08/2011  . Colon polyps   . Diabetes mellitus type II    Borderline  . Hyperlipidemia   . Hypertension 2006   AUTOMATIC IMPLANTABLE CARDIAC DEFIBRILLATOR -  . Non-ischemic cardiomyopathy ([place])     EF-35% in 2005, 40-45% in 2009; AICD in 2006  . Psoriasis   . Tobacco abuse, in remission 1995   30 pk-yrs; D/C in 1995    ROS:   All systems reviewed and negative except as noted in the HPI.   Past Surgical History:  Procedure Laterality Date  . CARDIAC DEFIBRILLATOR PLACEMENT     Dr. Lovena Le 08/2005.  [employer]  . COLONOSCOPY  2012   Negative screening study; remote history of polypectomy  . GENERATOR REMOVAL N/A 08/20/2013   Procedure: LEAD  CAP OFF;  Surgeon: Evans Lance, MD;  Location: [facility] CATH LAB;  Service: Cardiovascular;  Laterality: N/A;  . PARTIAL NEPHRECTOMY       History reviewed. No pertinent family history.   Social History   Socioeconomic History  . Marital status: Married    Spouse name: Not on file  . Number of children: Not on file  . Years of education: Not on file  . Highest education level: Not on file  Social Needs  . Financial resource strain: Not on file  . Food insecurity - worry: Not on file  . Food insecurity - inability: Not on file  . Transportation needs - medical: Not on file  . Transportation needs - non-medical: Not on file  Occupational History  . 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 Smoking status: Former Smoker    Packs/day: 1.50     Years: 40.00    Pack years: 60.00    Last attempt to quit: 10/11/1993    Years since quitting: 23.9  . Smokeless tobacco: Never Used  Substance and Sexual Activity  . Alcohol use: Yes    Alcohol/week: 0.5 oz    Types: 1 drink(s) per week  . Drug use: No  . Sexual activity: Not on file  Other Topics Concern  . Not on file  Social History Narrative  . Not on file     BP 128/76   Pulse 88   Ht 5\' 10"  (1.778 m)   Wt 236 lb (107 kg)   SpO2 97%   BMI 33.86 kg/m   Physical Exam:  Well appearing 76 year old man, NAD HEENT: Unremarkable Neck: 6 cm JVD, no thyromegally Lymphatics:  No adenopathy Back:  No CVA tenderness Lungs:  Clear, with no wheezes, rales, or rhonchi HEART:  IRegular rate rhythm, no murmurs, no rubs, no clicks Abd:  soft, positive bowel sounds, no organomegally, no rebound, no guarding Ext:  2 plus pulses, no edema, no cyanosis, no clubbing Skin:  No rashes no nodules Neuro:  CN II through XII intact, motor grossly intact  EKG -atrial fibrillation with a controlled ventricular response   Assess/Plan: 1.  Atrial fibrillation -the patient has a history of GI bleeding on systemic anticoagulation and refuses additional anticoagulation. 2.  Right shoulder loss of motion -his restriction in range of motion is fairly marked on my exam today.  The patient is quite active, and hopes to have improvement.  I discussed the options with the patient and will refer him to the [facility] orthopedics center for consultation. 3.  Dyslipidemia -he will continue his statin therapy with Lipitor. 4.  Hypertension -his blood pressure is well controlled.  He will continue his blood pressure lowering medications. 5.  Status post ICD generator removal -the patient's ICD has been removed and he has had no syncope or evidence of any ventricular arrhythmias.  Crissie Sickles, MD

## 2017-09-26 NOTE — Patient Instructions (Signed)
Medication Instructions:  Your physician recommends that you continue on your current medications as directed. Please refer to the Current Medication list given to you today.   Labwork: NONE   Testing/Procedures: NONE   Follow-Up: Your physician wants you to follow-up in: 1 Year with Dr. Lovena Le. You will receive a reminder letter in the mail two months in advance. If you don't receive a letter, please call our office to schedule the follow-up appointment.   Any Other Special Instructions Will Be Listed Below (If Applicable).  You have been referred to Dr. Veverly Fells for shoulder pain.    If you need a refill on your cardiac medications before your next appointment, please call your pharmacy.  Thank you for choosing [facility]!

## 2018-08-08 ENCOUNTER — Other Ambulatory Visit: Payer: Self-pay | Admitting: Internal Medicine

## 2018-08-08 DIAGNOSIS — I428 Other cardiomyopathies: Secondary | ICD-10-CM

## 2018-11-06 ENCOUNTER — Encounter: Payer: Self-pay | Admitting: Internal Medicine

## 2018-11-06 ENCOUNTER — Ambulatory Visit (INDEPENDENT_AMBULATORY_CARE_PROVIDER_SITE_OTHER): Payer: Medicare Other | Admitting: Internal Medicine

## 2018-11-06 VITALS — BP 139/76 | HR 87 | Ht 71.0 in | Wt 237.0 lb

## 2018-11-06 DIAGNOSIS — I4891 Unspecified atrial fibrillation: Secondary | ICD-10-CM

## 2018-11-06 NOTE — Progress Notes (Signed)
HPI [name] returns today for follow-up.  He is a very pleasant 78 year old man with a nonischemic cardiomyopathy, chronic systolic heart failure, status post ICD implantation.  He had normalization of his left ventricular function and his ICD was removed.  His ICD lead remains.  He is status post remote stroke involving the left brain.  He is on good medical therapy. He is not on systemic anti-coagulation due to gi bleeding on multiple drugs.  .No Known Allergies   Current Outpatient Medications  Medication Sig Dispense Refill  . amLODipine (NORVASC) 5 MG tablet TAKE 1 TABLET BY MOUTH  DAILY 90 tablet 3  . aspirin 325 MG tablet Take 325 mg by mouth daily.      Marland Kitchen atorvastatin (LIPITOR) 20 MG tablet Take 1 tablet (20 mg total) by mouth daily. 90 tablet 3  . carvedilol (COREG) 25 MG tablet TAKE 2 TABLETS BY MOUTH TWO TIMES DAILY WITH MEALS 360 tablet 3  . digoxin (LANOXIN) 0.125 MG tablet TAKE 1 TABLET BY MOUTH  DAILY 90 tablet 3  . furosemide (LASIX) 20 MG tablet TAKE 1 TABLET BY MOUTH  EVERY OTHER DAY 45 tablet 3  . glipiZIDE (GLUCOTROL) 5 MG tablet Take 5 mg by mouth daily before breakfast.     . losartan (COZAAR) 100 MG tablet Take 100 mg by mouth daily.    . meloxicam (MOBIC) 15 MG tablet Take 15 mg by mouth daily.    . metFORMIN (GLUCOPHAGE) 500 MG tablet Take 500 mg by mouth as directed. 500 mg am, and 1000 mg at dinner    . Multiple Vitamins-Minerals (MULTIVITAMIN WITH MINERALS) tablet Take 1 tablet by mouth daily.      Marland Kitchen spironolactone (ALDACTONE) 25 MG tablet Take 25 mg by mouth every other day.     No current facility-administered medications for this visit.      Past Medical History:  Diagnosis Date  . Arteriosclerotic cardiovascular disease (ASCVD)     non-critical with LM, LAD, RCA <50%; 75% Cx in a small vessel  . Atrial fibrillation ([place])    2006: epistaxis, UGI bleed and renal hematoma on coumadin->partial nephrectomy; subsequently has refused  anticoagulation  . Chronic kidney disease    +ARF, but creat. nl in 2009; 0.87 in 08/2011  . Colon polyps   . Diabetes mellitus type II    Borderline  . Hyperlipidemia   . Hypertension 2006   AUTOMATIC IMPLANTABLE CARDIAC DEFIBRILLATOR -  . Non-ischemic cardiomyopathy ([place])     EF-35% in 2005, 40-45% in 2009; AICD in 2006  . Psoriasis   . Tobacco abuse, in remission 1995   30 pk-yrs; D/C in 1995    ROS:   All systems reviewed and negative except as noted in the HPI.   Past Surgical History:  Procedure Laterality Date  . CARDIAC DEFIBRILLATOR PLACEMENT     Dr. Lovena [name] 08/2005.  [employer]  . COLONOSCOPY  2012   Negative screening study; remote history of polypectomy  . GENERATOR REMOVAL N/A 08/20/2013   Procedure: LEAD CAP OFF;  Surgeon: Ruben Lance, MD;  Location: [facility] CATH LAB;  Service: Cardiovascular;  Laterality: N/A;  . PARTIAL NEPHRECTOMY       History reviewed. No pertinent family history.   Social History   Socioeconomic History  . Marital status: Married    Spouse name: Not on file  . Number of children: Not on file  . Years of education: Not on file  . Highest education level: Not  on file  Occupational History  . 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 Financial resource strain: Not on file  . Food insecurity:    Worry: Not on file    Inability: Not on file  . Transportation needs:    Medical: Not on file    Non-medical: Not on file  Tobacco Use  . Smoking status: Former Smoker    Packs/day: 1.50    Years: 40.00    Pack years: 60.00    Last attempt to quit: 10/11/1993    Years since quitting: 25.0  . Smokeless tobacco: Never Used  Substance and Sexual Activity  . Alcohol use: Yes    Alcohol/week: 1.0 standard drinks    Types: 1 drink(s) per week  . Drug use: No  . Sexual activity: Not on file  Lifestyle  . Physical activity:    Days per week: Not on file    Minutes per session: Not on file  . Stress: Not on file    Relationships  . Social connections:    Talks on phone: Not on file    Gets together: Not on file    Attends religious service: Not on file    Active member of club or organization: Not on file    Attends meetings of clubs or organizations: Not on file    Relationship status: Not on file  . Intimate partner violence:    Fear of current or ex partner: Not on file    Emotionally abused: Not on file    Physically abused: Not on file    Forced sexual activity: Not on file  Other Topics Concern  . Not on file  Social History Narrative  . Not on file     BP 139/76   Pulse 87   Ht 5\' 11"  (1.803 m)   Wt 237 lb (107.5 kg)   SpO2 96%   BMI 33.05 kg/m   Physical Exam:  Well appearing NAD HEENT: Unremarkable Neck:  No JVD, no thyromegally Lymphatics:  No adenopathy Back:  No CVA tenderness Lungs:  Clear with no wheezes HEART:  Regular rate rhythm, no murmurs, no rubs, no clicks Abd:  soft, positive bowel sounds, no organomegally, no rebound, no guarding Ext:  2 plus pulses, no edema, no cyanosis, no clubbing Skin:  No rashes no nodules Neuro:  CN II through XII intact, motor grossly intact   DEVICE  Normal device function.  See PaceArt for details.   Assess/Plan: 1. Atrial fib - his rates are controlled. He will continue his current meds. He is unable to take systemic anti-coagulation. 2. HTN - his blood pressure is up a little. I have encouraged him to lose weight and reduce his salt intake. 3. Dyslipidemia - he will continue his statin therapy.  [name].D.

## 2018-11-06 NOTE — Patient Instructions (Signed)
Medication Instructions:  Your physician recommends that you continue on your current medications as directed. Please refer to the Current Medication list given to you today.  If you need a refill on your cardiac medications before your next appointment, please call your pharmacy.   Lab work: NONE  If you have labs (blood work) drawn today and your tests are completely normal, you will receive your results only by: . MyChart Message (if you have MyChart) OR . A paper copy in the mail If you have any lab test that is abnormal or we need to change your treatment, we will call you to review the results.  Testing/Procedures: NONE   Follow-Up: At [facility], you and your health needs are our priority.  As part of our continuing mission to provide you with exceptional heart care, we have created designated Provider Care Teams.  These Care Teams include your primary Cardiologist (physician) and Advanced Practice Providers (APPs -  Physician Assistants and Nurse Practitioners) who all work together to provide you with the care you need, when you need it. You will need a follow up appointment in 1 years.  Please call our office 2 months in advance to schedule this appointment.  You may see None or one of the following Advanced Practice Providers on your designated Care Team:   Amber Seiler, NP . Renee Ursuy, PA-C  Any Other Special Instructions Will Be Listed Below (If Applicable). Thank you for choosing [facility]!     

## 2018-11-13 ENCOUNTER — Other Ambulatory Visit: Payer: Self-pay | Admitting: Internal Medicine

## 2019-10-09 ENCOUNTER — Other Ambulatory Visit: Payer: Self-pay | Admitting: Internal Medicine

## 2019-10-11 HISTORY — PX: CATARACT EXTRACTION: SUR2

## 2019-12-28 ENCOUNTER — Ambulatory Visit (INDEPENDENT_AMBULATORY_CARE_PROVIDER_SITE_OTHER): Payer: Medicare Other | Admitting: Internal Medicine

## 2019-12-28 ENCOUNTER — Encounter: Payer: Self-pay | Admitting: Internal Medicine

## 2019-12-28 ENCOUNTER — Other Ambulatory Visit: Payer: Self-pay

## 2019-12-28 VITALS — BP 128/74 | HR 101 | Temp 98.2°F | Ht 71.0 in | Wt 234.6 lb

## 2019-12-28 DIAGNOSIS — I4891 Unspecified atrial fibrillation: Secondary | ICD-10-CM

## 2019-12-28 NOTE — Patient Instructions (Signed)

## 2019-12-28 NOTE — Progress Notes (Signed)
HPI Mr. Vannorman returns today for ongoing evaluation and management of his atrial fibrillation. He has a h/o non-ischemic CM and then normalization of his LV function. He opted not to have a new ICD placed. His old generator was removed but the leads were left in place. The patient has recovered fairly well from his old stroke. He does not have palpitations.  No Known Allergies   Current Outpatient Medications  Medication Sig Dispense Refill  . amLODipine (NORVASC) 5 MG tablet TAKE 1 TABLET BY MOUTH  DAILY 90 tablet 3  . aspirin 325 MG tablet Take 325 mg by mouth daily.      Marland Kitchen atorvastatin (LIPITOR) 20 MG tablet TAKE 1 TABLET BY MOUTH  DAILY 90 tablet 0  . carvedilol (COREG) 25 MG tablet TAKE 2 TABLETS BY MOUTH TWO TIMES DAILY WITH MEALS 360 tablet 3  . digoxin (LANOXIN) 0.125 MG tablet TAKE 1 TABLET BY MOUTH  DAILY 90 tablet 3  . furosemide (LASIX) 20 MG tablet TAKE 1 TABLET BY MOUTH  EVERY OTHER DAY 45 tablet 3  . glipiZIDE (GLUCOTROL) 5 MG tablet Take 5 mg by mouth daily before breakfast.     . losartan (COZAAR) 100 MG tablet Take 100 mg by mouth daily.    . meloxicam (MOBIC) 15 MG tablet Take 15 mg by mouth daily.    . metFORMIN (GLUCOPHAGE) 500 MG tablet Take 500 mg by mouth as directed. 500 mg am, and 1000 mg at dinner    . Multiple Vitamins-Minerals (MULTIVITAMIN WITH MINERALS) tablet Take 1 tablet by mouth daily.      Marland Kitchen spironolactone (ALDACTONE) 25 MG tablet Take 25 mg by mouth every other day.     No current facility-administered medications for this visit.     Past Medical History:  Diagnosis Date  . Arteriosclerotic cardiovascular disease (ASCVD)     non-critical with LM, LAD, RCA <50%; 75% Cx in a small vessel  . Atrial fibrillation ([place])    2006: epistaxis, UGI bleed and renal hematoma on coumadin->partial nephrectomy; subsequently has refused anticoagulation  . Chronic kidney disease    +ARF, but creat. nl in 2009; 0.87 in 08/2011  . Colon polyps   .  Diabetes mellitus type II    Borderline  . Hyperlipidemia   . Hypertension 2006   AUTOMATIC IMPLANTABLE CARDIAC DEFIBRILLATOR -  . Non-ischemic cardiomyopathy ([place])     EF-35% in 2005, 40-45% in 2009; AICD in 2006  . Psoriasis   . Tobacco abuse, in remission 1995   30 pk-yrs; D/C in 1995    ROS:   All systems reviewed and negative except as noted in the HPI.   Past Surgical History:  Procedure Laterality Date  . CARDIAC DEFIBRILLATOR PLACEMENT     Dr. Lovena Le 08/2005.  [employer]  . CATARACT EXTRACTION  10/11/2019  . COLONOSCOPY  2012   Negative screening study; remote history of polypectomy  . GENERATOR REMOVAL N/A 08/20/2013   Procedure: LEAD CAP OFF;  Surgeon: Evans Lance, MD;  Location: [facility] CATH LAB;  Service: Cardiovascular;  Laterality: N/A;  . PARTIAL NEPHRECTOMY       History reviewed. No pertinent family history.   Social History   Socioeconomic History  . Marital status: Married    Spouse name: Not on file  . Number of children: Not on file  . Years of education: Not on file  . Highest education level: Not on file  Occupational History  . 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 Smoking status: Former Smoker    Packs/day: 1.50    Years: 40.00    Pack years: 60.00    Quit date: 10/11/1993    Years since quitting: 26.2  . Smokeless tobacco: Never Used  Substance and Sexual Activity  . Alcohol use: Yes    Alcohol/week: 1.0 standard drinks    Types: 1 drink(s) per week  . Drug use: No  . Sexual activity: Not on file  Other Topics Concern  . Not on file  Social History Narrative  . Not on file   Social Determinants of Health   Financial Resource Strain:   . Difficulty of Paying Living Expenses:   Food Insecurity:   . Worried About Charity fundraiser in the Last Year:   . Arboriculturist in the Last Year:   Transportation Needs:   . Film/video editor (Medical):   Marland Kitchen Lack of Transportation (Non-Medical):   Physical  Activity:   . Days of Exercise per Week:   . Minutes of Exercise per Session:   Stress:   . Feeling of Stress :   Social Connections:   . Frequency of Communication with Friends and Family:   . Frequency of Social Gatherings with Friends and Family:   . Attends Religious Services:   . Active Member of Clubs or Organizations:   . Attends Archivist Meetings:   Marland Kitchen Marital Status:   Intimate Partner Violence:   . Fear of Current or Ex-Partner:   . Emotionally Abused:   Marland Kitchen Physically Abused:   . Sexually Abused:      BP 128/74   Pulse (!) 101   Temp 98.2 F (36.8 C)   Ht 5\' 11"  (1.803 m)   Wt 234 lb 9.6 oz (106.4 kg)   SpO2 96%   BMI 32.72 kg/m   Physical Exam:  Well appearing NAD HEENT: Unremarkable Neck:  No JVD, no thyromegally Lymphatics:  No adenopathy Back:  No CVA tenderness Lungs:  Clear with no wheezes HEART:  IRegular rate rhythm, no murmurs, no rubs, no clicks Abd:  soft, positive bowel sounds, no organomegally, no rebound, no guarding Ext:  2 plus pulses, no edema, no cyanosis, no clubbing Skin:  No rashes no nodules Neuro:  CN II through XII intact, motor grossly intact   Assess/Plan: 1. Atrial fib - his rates are controlled. He is unable to take systemic anti-coagulation due to GI bleeding. 2. HTN - his bp is well controlled on medical therapy 3. Obesity - this is an ongoing problem. He is encouraged to lose weight. 4. Dyslipidemia - he will continue his lipitor 20 mg daily. Fasting lipids when he returns.   Mikle Bosworth.D.

## 2020-01-14 ENCOUNTER — Other Ambulatory Visit: Payer: Self-pay | Admitting: Internal Medicine

## 2020-03-05 ENCOUNTER — Other Ambulatory Visit: Payer: Self-pay

## 2020-03-05 DIAGNOSIS — I428 Other cardiomyopathies: Secondary | ICD-10-CM

## 2020-03-05 MED ORDER — FUROSEMIDE 20 MG PO TABS: 20.0000 mg | ORAL_TABLET | ORAL | 3 refills | Status: DC

## 2020-03-05 MED ORDER — DIGOXIN 125 MCG PO TABS: 125.0000 ug | ORAL_TABLET | Freq: Every day | ORAL | 3 refills | Status: DC

## 2020-03-05 NOTE — Telephone Encounter (Signed)
Refilled digoxin and lasix

## 2020-12-28 ENCOUNTER — Other Ambulatory Visit: Payer: Self-pay | Admitting: Internal Medicine

## 2021-01-06 ENCOUNTER — Encounter: Payer: Self-pay | Admitting: Internal Medicine

## 2021-01-06 ENCOUNTER — Other Ambulatory Visit: Payer: Self-pay

## 2021-01-06 ENCOUNTER — Ambulatory Visit (INDEPENDENT_AMBULATORY_CARE_PROVIDER_SITE_OTHER): Payer: Medicare Other | Admitting: Internal Medicine

## 2021-01-06 VITALS — BP 150/76 | HR 84 | Ht 70.0 in | Wt 226.0 lb

## 2021-01-06 DIAGNOSIS — I4811 Longstanding persistent atrial fibrillation: Secondary | ICD-10-CM

## 2021-01-06 NOTE — Patient Instructions (Signed)

## 2021-01-06 NOTE — Progress Notes (Signed)
HPI [name] returns today for ongoing evaluation and management of his atrial fibrillation. He has a h/o non-ischemic CM and then normalization of his LV function. He opted not to have a new ICD placed. His old generator was removed but the leads were left in place. The patient has recovered fairly well from his old stroke. He does not have palpitations. He had GI bleeding on anti-coagulation. He remains active working in his garden.   No Known Allergies   Current Outpatient Medications  Medication Sig Dispense Refill  . amLODipine (NORVASC) 5 MG tablet TAKE 1 TABLET BY MOUTH  DAILY 90 tablet 3  . aspirin 325 MG tablet Take 325 mg by mouth daily.    Marland Kitchen atorvastatin (LIPITOR) 20 MG tablet TAKE 1 TABLET BY MOUTH  DAILY 90 tablet 3  . carvedilol (COREG) 25 MG tablet TAKE 2 TABLETS BY MOUTH TWO TIMES DAILY WITH MEALS 360 tablet 3  . digoxin (LANOXIN) 0.125 MG tablet TAKE 1 TABLET BY MOUTH  DAILY 90 tablet 3  . furosemide (LASIX) 20 MG tablet Take 1 tablet (20 mg total) by mouth every other day. 45 tablet 3  . glipiZIDE (GLUCOTROL) 5 MG tablet Take 5 mg by mouth daily before breakfast.     . losartan (COZAAR) 100 MG tablet Take 100 mg by mouth daily.    . meloxicam (MOBIC) 15 MG tablet Take 15 mg by mouth daily.    . metFORMIN (GLUCOPHAGE) 500 MG tablet Take 500 mg by mouth as directed. 500 mg am, and 1000 mg at dinner    . Multiple Vitamins-Minerals (MULTIVITAMIN WITH MINERALS) tablet Take 1 tablet by mouth daily.    Marland Kitchen spironolactone (ALDACTONE) 25 MG tablet Take 25 mg by mouth every other day.     No current facility-administered medications for this visit.     Past Medical History:  Diagnosis Date  . Arteriosclerotic cardiovascular disease (ASCVD)     non-critical with LM, LAD, RCA <50%; 75% Cx in a small vessel  . Atrial fibrillation ([place])    2006: epistaxis, UGI bleed and renal hematoma on coumadin->partial nephrectomy; subsequently has refused anticoagulation  . Chronic  kidney disease    +ARF, but creat. nl in 2009; 0.87 in 08/2011  . Colon polyps   . Diabetes mellitus type II    Borderline  . Hyperlipidemia   . Hypertension 2006   AUTOMATIC IMPLANTABLE CARDIAC DEFIBRILLATOR -  . Non-ischemic cardiomyopathy ([place])     EF-35% in 2005, 40-45% in 2009; AICD in 2006  . Psoriasis   . Tobacco abuse, in remission 1995   30 pk-yrs; D/C in 1995    ROS:   All systems reviewed and negative except as noted in the HPI.   Past Surgical History:  Procedure Laterality Date  . CARDIAC DEFIBRILLATOR PLACEMENT     Dr. Lovena Le 08/2005.  [employer]  . CATARACT EXTRACTION  10/11/2019  . COLONOSCOPY  2012   Negative screening study; remote history of polypectomy  . GENERATOR REMOVAL N/A 08/20/2013   Procedure: LEAD CAP OFF;  Surgeon: Evans Lance, MD;  [facility] CATH LAB;  Service: Cardiovascular;  Laterality: N/A;  . PARTIAL NEPHRECTOMY       History reviewed. No pertinent family history.   Social History   Socioeconomic History  . Marital status: Married    Spouse name: Not on file  . Number of children: Not on file  . Years of education: Not on file  . Highest education level:  Not on file  Occupational History  . 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 Smoking status: Former Smoker    Packs/day: 1.50    Years: 40.00    Pack years: 60.00    Quit date: 10/11/1993    Years since quitting: 27.2  . Smokeless tobacco: Never Used  Vaping Use  . Vaping Use: Never used  Substance and Sexual Activity  . Alcohol use: Yes    Alcohol/week: 1.0 standard drink    Types: 1 drink(s) per week  . Drug use: No  . Sexual activity: Not on file  Other Topics Concern  . Not on file  Social History Narrative  . Not on file   Social Determinants of Health   Financial Resource Strain: Not on file  Food Insecurity: Not on file  Transportation Needs: Not on file  Physical Activity: Not on file  Stress: Not on file  Social  Connections: Not on file  Intimate Partner Violence: Not on file     BP (!) 150/76   Pulse 84   Ht 5\' 10"  (1.778 m)   Wt 226 lb (102.5 kg)   SpO2 96%   BMI 32.43 kg/m   Physical Exam:  Well appearing 80 yo man, NAD HEENT: Unremarkable Neck:  No JVD, no thyromegally Lymphatics:  No adenopathy Back:  No CVA tenderness Lungs:  Clear HEART:  IRegular rate rhythm, no murmurs, no rubs, no clicks Abd:  soft, positive bowel sounds, no organomegally, no rebound, no guarding Ext:  2 plus pulses, no edema, no cyanosis, no clubbing Skin:  No rashes no nodules Neuro:  CN II through XII intact, motor grossly intact  EKG - atrial fib with a controlled VR  Assess/Plan: 1. Atrial fib - his rates are controlled. He is unable to take systemic anti-coagulation due to GI bleeding. 2. HTN - his bp is not well controlled on medical therapy. I encouraged him to lose weight. He is on a plethora of bp meds and I am not inclined to add more. A low sodium diet is encouraged. 3. Obesity - this is an ongoing problem. He is encouraged to lose weight. 4. Dyslipidemia - he will continue his lipitor 20 mg daily. Fasting lipids when he returns.   Ruben Peru, MD

## 2021-03-09 ENCOUNTER — Other Ambulatory Visit: Payer: Self-pay | Admitting: Internal Medicine

## 2021-03-09 DIAGNOSIS — I428 Other cardiomyopathies: Secondary | ICD-10-CM

## 2021-09-29 ENCOUNTER — Other Ambulatory Visit: Payer: Self-pay | Admitting: Internal Medicine

## 2021-09-30 NOTE — Telephone Encounter (Signed)
This is a [place] pt.  °

## 2021-12-26 ENCOUNTER — Other Ambulatory Visit: Payer: Self-pay | Admitting: Internal Medicine

## 2021-12-26 DIAGNOSIS — I428 Other cardiomyopathies: Secondary | ICD-10-CM

## 2022-03-27 ENCOUNTER — Other Ambulatory Visit: Payer: Self-pay | Admitting: Internal Medicine

## 2022-03-27 DIAGNOSIS — I428 Other cardiomyopathies: Secondary | ICD-10-CM

## 2022-07-13 ENCOUNTER — Ambulatory Visit: Payer: Medicare Other | Attending: Internal Medicine | Admitting: Internal Medicine

## 2022-07-13 ENCOUNTER — Encounter: Payer: Self-pay | Admitting: Internal Medicine

## 2022-07-13 VITALS — BP 130/80 | HR 88 | Ht 71.0 in | Wt 219.0 lb

## 2022-07-13 DIAGNOSIS — I4891 Unspecified atrial fibrillation: Secondary | ICD-10-CM | POA: Insufficient documentation

## 2022-07-13 NOTE — Progress Notes (Signed)
HPI Mr. Sane returns today for ongoing evaluation and management of his atrial fibrillation. He has a h/o non-ischemic CM and then normalization of his LV function. He opted not to have a new ICD placed. His old generator was removed but the leads were left in place. The patient has recovered fairly well from his old stroke. He does not have palpitations. He had GI bleeding on anti-coagulation. He remains active working in his garden.  No Known Allergies   Current Outpatient Medications  Medication Sig Dispense Refill   amLODipine (NORVASC) 5 MG tablet TAKE 1 TABLET BY MOUTH  DAILY 90 tablet 3   aspirin 325 MG tablet Take 325 mg by mouth daily.     atorvastatin (LIPITOR) 20 MG tablet TAKE 1 TABLET BY MOUTH  DAILY 90 tablet 3   carvedilol (COREG) 25 MG tablet TAKE 2 TABLETS BY MOUTH TWO TIMES DAILY WITH MEALS 360 tablet 3   digoxin (LANOXIN) 0.125 MG tablet TAKE 1 TABLET BY MOUTH  DAILY 90 tablet 3   furosemide (LASIX) 20 MG tablet TAKE 1 TABLET BY MOUTH EVERY  OTHER DAY 45 tablet 3   losartan (COZAAR) 100 MG tablet Take 100 mg by mouth daily.     meloxicam (MOBIC) 15 MG tablet Take 15 mg by mouth daily.     metFORMIN (GLUCOPHAGE) 500 MG tablet Take 500 mg by mouth as directed. 500 mg am, and 1000 mg at dinner     Multiple Vitamins-Minerals (MULTIVITAMIN WITH MINERALS) tablet Take 1 tablet by mouth daily.     spironolactone (ALDACTONE) 25 MG tablet Take 25 mg by mouth every other day.     glipiZIDE (GLUCOTROL) 5 MG tablet Take 5 mg by mouth daily before breakfast.      levothyroxine (SYNTHROID) 25 MCG tablet Take 25 mcg by mouth every morning.     tamsulosin (FLOMAX) 0.4 MG CAPS capsule Take 0.4 mg by mouth daily.     No current facility-administered medications for this visit.     Past Medical History:  Diagnosis Date   Arteriosclerotic cardiovascular disease (ASCVD)     non-critical with LM, LAD, RCA <50%; 75% Cx in a small vessel   Atrial fibrillation ([place])    2006:  epistaxis, UGI bleed and renal hematoma on coumadin->partial nephrectomy; subsequently has refused anticoagulation   Chronic kidney disease    +ARF, but creat. nl in 2009; 0.87 in 08/2011   Colon polyps    Diabetes mellitus type II    Borderline   Hyperlipidemia    Hypertension 2006   AUTOMATIC IMPLANTABLE CARDIAC DEFIBRILLATOR -   Non-ischemic cardiomyopathy ([place])     EF-35% in 2005, 40-45% in 2009; AICD in 2006   Psoriasis    Tobacco abuse, in remission 1995   30 pk-yrs; D/C in 1995    ROS:   All systems reviewed and negative except as noted in the HPI.   Past Surgical History:  Procedure Laterality Date   CARDIAC DEFIBRILLATOR PLACEMENT     Dr. Lovena Le 08/2005.  [employer]   CATARACT EXTRACTION  10/11/2019   COLONOSCOPY  2012   Negative screening study; remote history of polypectomy   GENERATOR REMOVAL N/A 08/20/2013   Procedure: LEAD CAP OFF;  Surgeon: Evans Lance, MD;  Location: [facility] CATH LAB;  Service: Cardiovascular;  Laterality: N/A;   PARTIAL NEPHRECTOMY       History reviewed. No pertinent family history.   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  Tobacco Use   Smoking status: Former    Packs/day: 1.50    Years: 40.00    Total pack years: 60.00    Types: Cigarettes    Quit date: 10/11/1993    Years since quitting: 28.7   Smokeless tobacco: Never  Vaping Use   Vaping Use: Never used  Substance and Sexual Activity   Alcohol use: Yes    Alcohol/week: 1.0 standard drink of alcohol    Types: 1 drink(s) per week   Drug use: No   Sexual activity: Not on file  Other Topics Concern   Not on file  Social History Narrative   Not on file   Social Determinants of Health   Financial Resource Strain: Not on file  Food  Insecurity: Not on file  Transportation Needs: Not on file  Physical Activity: Not on file  Stress: Not on file  Social Connections: Not on file  Intimate Partner Violence: Not on file     BP 130/80   Pulse 88   Ht '5\' 11"'$  (1.803 m)   Wt 219 lb (99.3 kg)   SpO2 95%   BMI 30.54 kg/m   Physical Exam:  Well appearing 81 yo man, NAD HEENT: Unremarkable Neck:  No JVD, no thyromegally Lymphatics:  No adenopathy Back:  No CVA tenderness Lungs:  Clear with no wheezes HEART:  IRegular rate rhythm, no murmurs, no rubs, no clicks Abd:  soft, positive bowel sounds, no organomegally, no rebound, no guarding Ext:  2 plus pulses, no edema, no cyanosis, no clubbing Skin:  No rashes no nodules Neuro:  CN II through XII intact, motor grossly intact  EKG - atrial fib with a controlled VR    Assess/Plan:  1. Atrial fib - his rates are controlled. He is unable to take systemic anti-coagulation due to GI bleeding. 2. HTN - his bp is now well controlled on medical therapy. I encouraged him to continue lose weight. He is on a plethora of bp meds and I am not inclined to add more. A low sodium diet is encouraged. 3. Obesity - He is encouraged to continue to lose weight. He is down 7 lbs.  4. Dyslipidemia - he will continue his lipitor 20 mg daily.    [name], MD

## 2022-07-13 NOTE — Patient Instructions (Signed)
Medication Instructions:  Your physician recommends that you continue on your current medications as directed. Please refer to the Current Medication list given to you today.  *If you need a refill on your cardiac medications before your next appointment, please call your pharmacy*   Lab Work: NONE   If you have labs (blood work) drawn today and your tests are completely normal, you will receive your results only by: MyChart Message (if you have MyChart) OR A paper copy in the mail If you have any lab test that is abnormal or we need to change your treatment, we will call you to review the results.   Testing/Procedures: NONE    Follow-Up: At [facility], you and your health needs are our priority.  As part of our continuing mission to provide you with exceptional heart care, we have created designated Provider Care Teams.  These Care Teams include your primary Cardiologist (physician) and Advanced Practice Providers (APPs -  Physician Assistants and Nurse Practitioners) who all work together to provide you with the care you need, when you need it.  We recommend signing up for the patient portal called "MyChart".  Sign up information is provided on this After Visit Summary.  MyChart is used to connect with patients for Virtual Visits (Telemedicine).  Patients are able to view lab/test results, encounter notes, upcoming appointments, etc.  Non-urgent messages can be sent to your provider as well.   To learn more about what you can do with MyChart, go to [email or web address].    Your next appointment:   1 year(s)  The format for your next appointment:   In Person  Provider:   Gregg Taylor, MD    Other Instructions Thank you for choosing [facility]!    Important Information About Sugar       

## 2022-09-05 ENCOUNTER — Other Ambulatory Visit: Payer: Self-pay | Admitting: Internal Medicine

## 2023-02-01 ENCOUNTER — Other Ambulatory Visit: Payer: Self-pay | Admitting: Internal Medicine

## 2023-02-01 DIAGNOSIS — I428 Other cardiomyopathies: Secondary | ICD-10-CM

## 2023-07-21 ENCOUNTER — Ambulatory Visit: Payer: Medicare Other | Attending: Internal Medicine | Admitting: Internal Medicine

## 2023-07-21 ENCOUNTER — Other Ambulatory Visit: Payer: Self-pay | Admitting: Internal Medicine

## 2023-07-21 ENCOUNTER — Encounter: Payer: Self-pay | Admitting: Internal Medicine

## 2023-07-21 VITALS — BP 126/72 | HR 62 | Ht 71.0 in | Wt 215.0 lb

## 2023-07-21 DIAGNOSIS — I4891 Unspecified atrial fibrillation: Secondary | ICD-10-CM

## 2023-07-21 NOTE — Progress Notes (Signed)
HPI [name] returns today for ongoing evaluation and management of his atrial fibrillation. He has a h/o non-ischemic CM and then normalization of his LV function. He opted not to have a new ICD placed. His old generator was removed but the leads were left in place. The patient has recovered fairly well from his old stroke. He does not have palpitations. He had GI bleeding on anti-coagulation. He remains active working in his garden.   No Known Allergies   Current Outpatient Medications  Medication Sig Dispense Refill   amLODipine (NORVASC) 10 MG tablet Take 10 mg by mouth daily.     aspirin 325 MG tablet Take 325 mg by mouth daily.     atorvastatin (LIPITOR) 20 MG tablet TAKE 1 TABLET BY MOUTH ONCE  DAILY 90 tablet 3   carvedilol (COREG) 25 MG tablet TAKE 2 TABLETS BY MOUTH TWO TIMES DAILY WITH MEALS 360 tablet 3   digoxin (LANOXIN) 0.125 MG tablet TAKE 1 TABLET BY MOUTH DAILY 90 tablet 3   furosemide (LASIX) 20 MG tablet TAKE 1 TABLET BY MOUTH EVERY  OTHER DAY 45 tablet 3   glipiZIDE (GLUCOTROL) 5 MG tablet Take 5 mg by mouth daily before breakfast.      losartan (COZAAR) 100 MG tablet Take 100 mg by mouth daily.     Multiple Vitamins-Minerals (MULTIVITAMIN WITH MINERALS) tablet Take 1 tablet by mouth daily.     spironolactone (ALDACTONE) 25 MG tablet Take 25 mg by mouth every other day.     XIGDUO XR 07-999 MG TB24 Take 1 tablet by mouth daily.     No current facility-administered medications for this visit.     Past Medical History:  Diagnosis Date   Arteriosclerotic cardiovascular disease (ASCVD)     non-critical with LM, LAD, RCA <50%; 75% Cx in a small vessel   Atrial fibrillation ([facility])    2006: epistaxis, UGI bleed and renal hematoma on coumadin->partial nephrectomy; subsequently has refused anticoagulation   Chronic kidney disease    +ARF, but creat. nl in 2009; 0.87 in 08/2011   Colon polyps    Diabetes mellitus type II    Borderline   Hyperlipidemia     Hypertension 2006   AUTOMATIC IMPLANTABLE CARDIAC DEFIBRILLATOR -   Non-ischemic cardiomyopathy ([facility])     EF-35% in 2005, 40-45% in 2009; AICD in 2006   Psoriasis    Tobacco abuse, in remission 1995   30 pk-yrs; D/C in 1995    ROS:   All systems reviewed and negative except as noted in the HPI.   Past Surgical History:  Procedure Laterality Date   CARDIAC DEFIBRILLATOR PLACEMENT     Dr. Ladona Ridgel 08/2005.  [employer]   CATARACT EXTRACTION  10/11/2019   COLONOSCOPY  2012   Negative screening study; remote history of polypectomy   GENERATOR REMOVAL N/A 08/20/2013   Procedure: LEAD CAP OFF;  Surgeon: Marinus Maw, MD;  Location[facility] CATH LAB;  Service: Cardiovascular;  Laterality: N/A;   PARTIAL NEPHRECTOMY       No family history on file.   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  Tobacco Use   Smoking status: Former    Current packs/day:  0.00    Average packs/day: 1.5 packs/day for 40.0 years (60.0 ttl pk-yrs)    Types: Cigarettes    Start date: 10/11/1953    Quit date: 10/11/1993    Years since quitting: 29.7   Smokeless tobacco: Never  Vaping Use   Vaping status: Never Used  Substance and Sexual Activity   Alcohol use: Yes    Alcohol/week: 1.0 standard drink of alcohol    Types: 1 drink(s) per week   Drug use: No   Sexual activity: Not on file  Other Topics Concern   Not on file  Social History Narrative   Not on file   Social Determinants of Health   Financial Resource Strain: Not on file  Food Insecurity: Not on file  Transportation Needs: Not on file  Physical Activity: Not on file  Stress: Not on file  Social Connections: Not on file  Intimate Partner Violence: Not on file     BP 126/72   Pulse 62   Ht 5\' 11"   (1.803 m)   Wt 215 lb (97.5 kg)   BMI 29.99 kg/m   Physical Exam:  Well appearing NAD HEENT: Unremarkable Neck:  No JVD, no thyromegally Lymphatics:  No adenopathy Back:  No CVA tenderness Lungs:  Clear HEART:  Regular rate rhythm, no murmurs, no rubs, no clicks Abd:  soft, positive bowel sounds, no organomegally, no rebound, no guarding Ext:  2 plus pulses, no edema, no cyanosis, no clubbing Skin:  No rashes no nodules Neuro:  CN II through XII intact, motor grossly intact  EKG  DEVICE  Normal device function.  See PaceArt for details.   Assess/Plan: 1. Atrial fib - his rates are controlled. He is unable to take systemic anti-coagulation due to GI bleeding. 2. HTN - his bp is now well controlled on medical therapy. I encouraged him to continue lose weight. He is on a plethora of bp meds and I am not inclined to add more. A low sodium diet is encouraged. 3. Obesity - He is encouraged to continue to lose weight. He is down 4 lbs.  4. Dyslipidemia - he will continue his lipitor 20 mg daily.    Lewayne Bunting, MD

## 2023-07-21 NOTE — Patient Instructions (Signed)
Medication Instructions:  Your physician recommends that you continue on your current medications as directed. Please refer to the Current Medication list given to you today.  *If you need a refill on your cardiac medications before your next appointment, please call your pharmacy*   Lab Work: None If you have labs (blood work) drawn today and your tests are completely normal, you will receive your results only by: MyChart Message (if you have MyChart) OR A paper copy in the mail If you have any lab test that is abnormal or we need to change your treatment, we will call you to review the results.   Testing/Procedures: None   Follow-Up: At [facility], you and your health needs are our priority.  As part of our continuing mission to provide you with exceptional heart care, we have created designated Provider Care Teams.  These Care Teams include your primary Cardiologist (physician) and Advanced Practice Providers (APPs -  Physician Assistants and Nurse Practitioners) who all work together to provide you with the care you need, when you need it.  We recommend signing up for the patient portal called "MyChart".  Sign up information is provided on this After Visit Summary.  MyChart is used to connect with patients for Virtual Visits (Telemedicine).  Patients are able to view lab/test results, encounter notes, upcoming appointments, etc.  Non-urgent messages can be sent to your provider as well.   To learn more about what you can do with MyChart, go to [email or web address].    Your next appointment:   1 year(s)  Provider:   Gregg Taylor, MD    Other Instructions    

## 2023-12-14 ENCOUNTER — Other Ambulatory Visit: Payer: Self-pay | Admitting: Internal Medicine

## 2023-12-14 DIAGNOSIS — I428 Other cardiomyopathies: Secondary | ICD-10-CM

## 2024-01-16 ENCOUNTER — Encounter: Payer: Self-pay | Admitting: *Deleted

## 2024-01-16 ENCOUNTER — Telehealth: Payer: Self-pay | Admitting: Internal Medicine

## 2024-01-16 NOTE — Telephone Encounter (Signed)
 Pt called in stating he was in the hospital in [place]. He states he saw Dr. Madelyn Flavors and Dr. Heide Spark. He states he had a stroke twice and they suggested he call Dr. Ladona Ridgel to discuss having a watchman procedure.

## 2024-01-16 NOTE — Telephone Encounter (Signed)
 Advised that records would be requested from [facility] and message would be sent to provider for review. Advised that he would be contacted with Dr. Lubertha Basque recommendations afterwards. Verbalized understanding.

## 2024-01-26 NOTE — Telephone Encounter (Signed)
 Message sent to front desk schedulers to make appointment with pt for discussion of watchman.

## 2024-01-26 NOTE — Telephone Encounter (Signed)
 Let me see him back in the office to discuss. GT

## 2024-01-27 NOTE — Telephone Encounter (Signed)
 Attempted to call patient to offer first available appointment with provider in [place] at [facility] on 4/25, as provider does not have any openings in the [place] office until 04/2024. Left a message regarding a call back to discuss.

## 2024-01-30 NOTE — Telephone Encounter (Signed)
 Patient has appt with provider on 4/30 at 9:30 am at the [facility] office.

## 2024-02-08 ENCOUNTER — Ambulatory Visit: Attending: Internal Medicine | Admitting: Internal Medicine

## 2024-02-08 ENCOUNTER — Encounter: Payer: Self-pay | Admitting: Internal Medicine

## 2024-02-08 VITALS — BP 124/60 | HR 72 | Ht 71.0 in | Wt 199.0 lb

## 2024-02-08 DIAGNOSIS — I4891 Unspecified atrial fibrillation: Secondary | ICD-10-CM | POA: Diagnosis present

## 2024-02-08 DIAGNOSIS — E785 Hyperlipidemia, unspecified: Secondary | ICD-10-CM | POA: Diagnosis present

## 2024-02-08 NOTE — Progress Notes (Addendum)
 HPI [name] returns today for ongoing evaluation and management of his atrial fibrillation. He has a h/o non-ischemic CM and then normalization of his LV function. He opted not to have a new ICD placed. His old generator was removed but the leads were left in place. The patient has recovered fairly well from his old stroke. He does not have palpitations. He had GI bleeding on anti-coagulation. No Known Allergies   Current Outpatient Medications  Medication Sig Dispense Refill   amLODipine  (NORVASC ) 10 MG tablet Take 10 mg by mouth daily.     atorvastatin  (LIPITOR) 20 MG tablet TAKE 1 TABLET BY MOUTH ONCE  DAILY 90 tablet 3   BRILINTA 90 MG TABS tablet Take 90 mg by mouth 2 (two) times daily.     carvedilol  (COREG ) 25 MG tablet TAKE 2 TABLETS BY MOUTH TWO TIMES DAILY WITH MEALS 360 tablet 3   digoxin  (LANOXIN ) 0.125 MG tablet TAKE 1 TABLET BY MOUTH DAILY 90 tablet 3   furosemide  (LASIX ) 20 MG tablet TAKE 1 TABLET BY MOUTH EVERY  OTHER DAY 45 tablet 3   glipiZIDE (GLUCOTROL) 5 MG tablet Take 5 mg by mouth daily before breakfast.      losartan  (COZAAR ) 100 MG tablet Take 100 mg by mouth daily.     Multiple Vitamins-Minerals (MULTIVITAMIN WITH MINERALS) tablet Take 1 tablet by mouth daily.     spironolactone  (ALDACTONE ) 25 MG tablet Take 25 mg by mouth every other day.     XIGDUO XR 07-999 MG TB24 Take 1 tablet by mouth daily.     aspirin  EC 81 MG tablet Take 1 tablet (81 mg total) by mouth daily. Swallow whole. 30 tablet 6   tamsulosin (FLOMAX) 0.4 MG CAPS capsule Take by mouth.     No current facility-administered medications for this visit.     Past Medical History:  Diagnosis Date   Arteriosclerotic cardiovascular disease (ASCVD)     non-critical with LM, LAD, RCA <50%; 75% Cx in a small vessel   Atrial fibrillation (HCC)    2006: epistaxis, UGI bleed and renal hematoma on coumadin->partial nephrectomy; subsequently has refused anticoagulation   Chronic kidney disease     +ARF, but creat. nl in 2009; 0.87 in 08/2011   Colon polyps    Diabetes mellitus type II    Borderline   Hyperlipidemia    Hypertension 2006   AUTOMATIC IMPLANTABLE CARDIAC DEFIBRILLATOR -   Non-ischemic cardiomyopathy (HCC)     EF-35% in 2005, 40-45% in 2009; AICD in 2006   Psoriasis    Tobacco abuse, in remission 1995   30 pk-yrs; D/C in 1995    ROS:   All systems reviewed and negative except as noted in the HPI.   Past Surgical History:  Procedure Laterality Date   CARDIAC DEFIBRILLATOR PLACEMENT     Dr. Carolynne Citron 08/2005.  [employer]   CATARACT EXTRACTION  10/11/2019   COLONOSCOPY  2012   Negative screening study; remote history of polypectomy   GENERATOR REMOVAL N/A 08/20/2013   Procedure: LEAD CAP OFF;  Surgeon: Tammie Fall, MD;  Location: [facility] CATH LAB;  Service: Cardiovascular;  Laterality: N/A;   PARTIAL NEPHRECTOMY       History reviewed. No pertinent family history.   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  Tobacco Use   Smoking status: Former    Current packs/day: 0.00    Average packs/day: 1.5 packs/day for 40.0 years (60.0 ttl pk-yrs)    Types: Cigarettes    Start date: 10/11/1953    Quit date: 10/11/1993    Years since quitting: 30.4   Smokeless tobacco: Never  Vaping Use   Vaping status: Never Used  Substance and Sexual Activity   Alcohol use: Yes    Alcohol/week: 1.0 standard drink of alcohol    Types: 1 drink(s) per week   Drug use: No   Sexual activity: Not on file  Other Topics Concern   Not on file  Social History Narrative   Not on file   Social Drivers of Health   Financial Resource Strain: Not on file  Food Insecurity: Not on file  Transportation Needs: Not on file  Physical Activity: Not on file   Stress: Not on file  Social Connections: Not on file  Intimate Partner Violence: Not on file     BP 124/60   Pulse 72   Ht 5\' 11"  (1.803 m)   Wt 199 lb (90.3 kg)   SpO2 97%   BMI 27.75 kg/m   Physical Exam:  Well appearing NAD HEENT: Unremarkable Neck:  No JVD, no thyromegally Lymphatics:  No adenopathy Back:  No CVA tenderness Lungs:  Clear HEART:  Regular rate rhythm, no murmurs, no rubs, no clicks Abd:  soft, positive bowel sounds, no organomegally, no rebound, no guarding Ext:  2 plus pulses, no edema, no cyanosis, no clubbing Skin:  No rashes no nodules Neuro:  CN II through XII intact, motor grossly intact  EKG  DEVICE  Normal device function.  See PaceArt for details.   Assess/Plan: 1. Atrial fib - his rates are controlled. He is unable to take systemic anti-coagulation due to GI bleeding. I will refer him to Dr. Daneil Dunker to discuss Watchman. He is a borderline candidate due to the risk of GI bleeding around the time of his procedure but with a total of 3 strokes, the risk/benefit ration might still favor at least a consideration for Watchman. 2. HTN - his bp is now well controlled on medical therapy. I encouraged him to continue lose weight. He is on a plethora of bp meds and I am not inclined to add more. A low sodium diet is encouraged. 3. Obesity - He has lost 20 lbs.  4. Dyslipidemia - he will continue his lipitor 20 mg daily.    [name] Referral for Left Atrial Appendage Closure with Non-Valvular Atrial Fibrillation   IMRI LOR is a 83 y.o. male is being referred to the [facility] Team for evaluation for Left Atrial Appendage Closure with Watchman device for the management of stroke risk resulting form non-valvular atrial fibrillation.    Base upon [name] history, he is felt to be a poor candidate for long-term anticoagulation because of a history of bleeding (e.g. intracerebral, subdural, GI, retro-peritoneal).  The  patient has a HAS-BLED score of 5 indicating a Yearly Major Bleeding Risk of 12.5%.      His CHADS2-VASc Score is 6 with an unadjusted Ischemic Stroke Rate (% per year) of 9.7%.    His stroke risk necessitates a strategy of stroke prevention with either long-term oral anticoagulation or left atrial appendage occlusion therapy. We have discussed their bleeding risk in the  context of their comorbid medical problems, as well as the rationale for referral for evaluation of Watchman left atrial appendage occlusion therapy. While the patient is at high long-term bleeding risk, they may be appropriate for short-term anticoagulation. Based on this individual patient's stroke and bleeding risk, a shared decision has been made to refer the patient for consideration of Watchman left atrial appendage closure utilizing the [employer] shared decision tool.    Manya Sells, MD

## 2024-02-08 NOTE — Addendum Note (Signed)
 Addended by: Daryl Quiros N on: 02/08/2024 09:18 AM   Modules accepted: Orders

## 2024-02-08 NOTE — Patient Instructions (Signed)
 Medication Instructions:  Your physician recommends that you continue on your current medications as directed. Please refer to the Current Medication list given to you today.  *If you need a refill on your cardiac medications before your next appointment, please call your pharmacy*  Lab Work: None ordered.  You may go to any [facility] Location for your lab work:  [place] - [address] ([facility]) - [address] [phone] [address]  [place] - [address]   [place]  - [address]   [place] - [address] - [address] [employer]  - [address] ([facility]   If you have labs (blood work) drawn today and your tests are completely normal, you will receive your results only by: [employer] (if you have MyChart)  If you have any lab test that is abnormal or we need to change your treatment, we will call you or send a MyChart message to review the results.  Testing/Procedures: None ordered.  Follow-Up: At [facility], you and your health needs are our priority.  As part of our continuing mission to provide you with exceptional heart care, we have created designated Provider Care Teams.  These Care Teams include your primary Cardiologist (physician) and Advanced Practice Providers (APPs -  Physician Assistants and Nurse Practitioners) who all work together to provide you with the care you need, when you need it.  We recommend signing up for the patient portal called "MyChart".  Sign up information is provided on this After Visit Summary.  MyChart is used to connect with patients for Virtual Visits (Telemedicine).  Patients are able to view lab/test results, encounter notes, upcoming appointments, etc.  Non-urgent messages can be sent to your provider as well.   To learn more about what you can do with MyChart, go to  [email or web address].    Your next appointment:   Referral to Dr Daneil Dunker for [facility]  The format for your next appointment:   In Person  Provider:   Clinton Danas, MD  Note: Remote monitoring is used to monitor your Pacemaker/ ICD from home. This monitoring reduces the number of office visits required to check your device to one time per year. It allows us  to keep an eye on the functioning of your device to ensure it is working properly.

## 2024-02-10 ENCOUNTER — Telehealth: Payer: Self-pay | Admitting: Internal Medicine

## 2024-02-10 NOTE — Telephone Encounter (Signed)
 Called patient. Unable to leave a message- voicemail full.   Did not see any mention of a different medication. Saw that he recommended a Watchman.

## 2024-02-10 NOTE — Telephone Encounter (Signed)
  Pt c/o medication issue:  1. Name of Medication: new blood thinner   2. How are you currently taking this medication (dosage and times per day)?   3. Are you having a reaction (difficulty breathing--STAT)? No   4. What is your medication issue? The patient would like to know which new blood thinner Dr. Carolynne Citron recommended, as he has forgotten the name

## 2024-02-13 NOTE — Telephone Encounter (Signed)
 Pt called. VM full and was unable to leave message.

## 2024-02-15 NOTE — Telephone Encounter (Signed)
 Please refer to Dr. Marven Slimmer to discuss Watchman. He is not a long term candidate for systemic anti-coag.

## 2024-02-16 NOTE — Telephone Encounter (Signed)
 Pt was referred to Dr Daneil Dunker during visit for watchmen and has appointment with him on 03/09/24.

## 2024-03-04 NOTE — Telephone Encounter (Signed)
 perfect

## 2024-03-08 NOTE — H&P (View-Only) (Signed)
 Electrophysiology Office Note:   Date:  03/09/2024  ID:  [name], DOB 26-Jul-1941, MRN [id number]  Primary Cardiologist: Ruben Sells, MD Electrophysiologist: None      History of Present Illness:   [name] is a 83 y.o. male with h/o atrial fibrillation, multiple strokes, systolic heart failure with improved ejection fraction, HTN and recurrent GI bleeding who is being seen today for evaluation for Watchman device implant at the request of Dr. Carolynne [name].  Discussed the use of AI scribe software for clinical note transcription with the patient, who gave verbal consent to proceed.  History of Present Illness [name] is an 83 year old male with atrial fibrillation and a history of multiple strokes who presents for evaluation of a Watchman device. He was referred by Dr. Carolynne [name] for evaluation of a Watchman device due to atrial fibrillation and stroke risk. He experiences persistent atrial fibrillation. He is not on blood thinners due to a history of serious gastrointestinal bleeding but is currently taking Brilinta (ticagrelor). He has a history of multiple strokes, including a significant one in March, resulting in persistent right hand weakness. He also describes visual disturbances following cataract surgery, which he attributes to possible mini-strokes. He does not drive and relies on his son for transportation. He is worried about his long-term stroke risk as well as bleeding risk with anti-coagulation.  Review of systems complete and found to be negative unless listed in HPI.   EP Information / Studies Reviewed:    EKG is not ordered today. EKG from 02/08/24 reviewed which showed AF.      Echo 05/2013:  - Left ventricle: Technically severely limited. I think the    EF may be as high as 50%. The cavity size was normal. Wall    thickness was increased in a pattern of mild LVH.  - Right ventricle: Poorly visualized. The cavity size was    mildly dilated. Pacer  wire or catheter noted in right    ventricle. Systolic function was normal.  - Pulmonary arteries: PA peak pressure: 36mm Hg (S).   Risk Assessment/Calculations:    CHA2DS2-VASc Score = 6   This indicates a 9.7% annual risk of stroke. The patient's score is based upon: CHF History: 1 HTN History: 1 Diabetes History: 0 Stroke History: 2 Vascular Disease History: 0 Age Score: 2 Gender Score: 0             Physical Exam:   VS:  BP 114/62   Pulse 72   Ht 5' 11 (1.803 m)   Wt 200 lb (90.7 kg)   SpO2 98%   BMI 27.89 kg/m    Wt Readings from Last 3 Encounters:  03/09/24 200 lb (90.7 kg)  02/08/24 199 lb (90.3 kg)  07/21/23 215 lb (97.5 kg)     GEN: Well nourished, well developed in no acute distress NECK: No JVD CARDIAC: Normal rate, irregular rhythm RESPIRATORY:  Clear to auscultation without rales, wheezing or rhonchi  ABDOMEN: Soft, non-distended EXTREMITIES:  No edema; No deformity   ASSESSMENT AND PLAN:   I have seen [name] in the office today who is being considered for a Watchman left atrial appendage closure device. I believe they will benefit from this procedure given their history of atrial fibrillation, CHA2DS2-VASc score of 6 and unadjusted ischemic stroke rate of 9.7% per year. Unfortunately, the patient is not felt to be a long term anticoagulation candidate secondary to recurrent GI bleeding. The patient's chart has been  reviewed and I feel that they would be a candidate for short term oral anticoagulation after Watchman implant.   It is my belief that after undergoing a LAA closure procedure, [name] will not need long term anticoagulation which eliminates anticoagulation side effects and major bleeding risk.   Procedural risks for the Watchman implant have been reviewed with the patient including a 0.5% risk of stroke, <1% risk of perforation and <1% risk of device embolization. Other risks include bleeding, vascular damage, tamponade,  worsening renal function, and death. The patient understands these risk and wishes to proceed.     The published clinical data on the safety and effectiveness of WATCHMAN include but are not limited to the following: - Holmes DR, Evalene Hilda, Sick P et al. for the PROTECT AF Investigators. Percutaneous closure of the left atrial appendage versus warfarin therapy for prevention of stroke in patients with atrial fibrillation: a randomised non-inferiority trial. Lancet 2009; 374: 534-42. Evalene Hilda, Doshi SK, Deloria Fetch D et al. on behalf of the PROTECT AF Investigators. Percutaneous Left Atrial Appendage Closure for Stroke Prophylaxis in Patients With Atrial Fibrillation 2.3-Year Follow-up of the PROTECT AF (Watchman Left Atrial Appendage System for Embolic Protection in Patients With Atrial Fibrillation) Trial. Circulation 2013; 127:720-729. - Alli O, Doshi S,  Kar S, Reddy VY, Sievert H et al. Quality of Life Assessment in the Randomized PROTECT AF (Percutaneous Closure of the Left Atrial Appendage Versus Warfarin Therapy for Prevention of Stroke in Patients With Atrial Fibrillation) Trial of Patients at Risk for Stroke With Nonvalvular Atrial Fibrillation. J Am Coll Cardiol 2013; 61:1790-8. Bartholome Ligas DR, Mario Sicard, Price M, Whisenant B, Sievert H, Doshi S, Huber K, Reddy V. Prospective randomized evaluation of the Watchman left atrial appendage Device in patients with atrial fibrillation versus long-term warfarin therapy; the PREVAIL trial. Journal of the [employer] of Cardiology, Vol. 4, No. 1, 2014, 1-11. - Kar S, Doshi SK, Sadhu A, Horton R, Osorio J et al. Primary outcome evaluation of a next-generation left atrial appendage closure device: results from the PINNACLE FLX trial. Circulation 2021;143(18)1754-1762.    After today's visit with the patient which was dedicated solely for shared decision making visit regarding LAA closure device, the patient decided to proceed with the LAA appendage  closure procedure scheduled to be done in the near future at [facility]. Prior to the procedure, I would like to obtain a gated CT scan of the chest with contrast timed for PV/LA visualization.   Additionally, the patient will need an updated echocardiogram.  HAS-BLED score 5 Hypertension Yes  Abnormal renal and liver function (Dialysis, transplant, Cr >2.26 mg/dL /Cirrhosis or Bilirubin >2x Normal or AST/ALT/AP >3x Normal) No  Stroke Yes  Bleeding Yes  Labile INR (Unstable/high INR) No  Elderly (>65) Yes  Drugs or alcohol (>= 8 drinks/week, anti-plt or NSAID) Yes   CHA2DS2-VASc Score = 6  The patient's score is based upon: CHF History: 1 HTN History: 1 Diabetes History: 0 Stroke History: 2 Vascular Disease History: 0 Age Score: 2 Gender Score: 0       ASSESSMENT AND PLAN: Permanent Atrial Fibrillation  -Rate control with carvedilol  25mg  BID and digoxin  0.125mg  daily.  Secondary Hypercoagulable State  GI Bleeding H/o CVA -The patient is at significant risk for stroke/thromboembolism based upon his CHA2DS2-VASc Score of 6.  However, the patient is not on anticoagulation due to his high bleeding risk. He is tolerating Ticagrelor 90mg  BID. He needs anti-platelet therapy  for recent strokes. He cannot tolerate DOAC and anti-platelet therapy together. We will start aspirin  81mg  once daily and plan to perform Watchman implant on DAPT.  Follow up with Dr. Daneil Dunker 3 months after [facility] implant.   Signed, [name], MD

## 2024-03-08 NOTE — Progress Notes (Addendum)
 Electrophysiology Office Note:   Date:  03/09/2024  ID:  [name], DOB Feb 19, 1941, MRN [id number]  Primary Cardiologist: Danelle Birmingham, MD Electrophysiologist: None      History of Present Illness:   [name] is a 83 y.o. male with h/o atrial fibrillation, multiple strokes, systolic heart failure with improved ejection fraction, HTN and recurrent GI bleeding who is being seen today for evaluation for Watchman device implant at the request of Dr. Birmingham.  Discussed the use of AI scribe software for clinical note transcription with the patient, who gave verbal consent to proceed.  History of Present Illness [name] is an 83 year old male with atrial fibrillation and a history of multiple strokes who presents for evaluation of a Watchman device. He was referred by Dr. Birmingham for evaluation of a Watchman device due to atrial fibrillation and stroke risk. He experiences persistent atrial fibrillation. He is not on blood thinners due to a history of serious gastrointestinal bleeding but is currently taking Brilinta  (ticagrelor ). He has a history of multiple strokes, including a significant one in March, resulting in persistent right hand weakness. He also describes visual disturbances following cataract surgery, which he attributes to possible mini-strokes. He does not drive and relies on his son for transportation. He is worried about his long-term stroke risk as well as bleeding risk with anti-coagulation.  Review of systems complete and found to be negative unless listed in HPI.   EP Information / Studies Reviewed:    EKG is not ordered today. EKG from 02/08/24 reviewed which showed AF.      Echo 05/2013:  - Left ventricle: Technically severely limited. I think the    EF may be as high as 50%. The cavity size was normal. Wall    thickness was increased in a pattern of mild LVH.  - Right ventricle: Poorly visualized. The cavity size was    mildly dilated. Pacer  wire or catheter noted in right    ventricle. Systolic function was normal.  - Pulmonary arteries: PA peak pressure: 36mm Hg (S).   Risk Assessment/Calculations:    CHA2DS2-VASc Score = 6   This indicates a 9.7% annual risk of stroke. The patient's score is based upon: CHF History: 1 HTN History: 1 Diabetes History: 0 Stroke History: 2 Vascular Disease History: 0 Age Score: 2 Gender Score: 0             Physical Exam:   VS:  BP 114/62   Pulse 72   Ht 5' 11 (1.803 m)   Wt 200 lb (90.7 kg)   SpO2 98%   BMI 27.89 kg/m    Wt Readings from Last 3 Encounters:  03/09/24 200 lb (90.7 kg)  02/08/24 199 lb (90.3 kg)  07/21/23 215 lb (97.5 kg)     GEN: Well nourished, well developed in no acute distress NECK: No JVD CARDIAC: Normal rate, irregular rhythm RESPIRATORY:  Clear to auscultation without rales, wheezing or rhonchi  ABDOMEN: Soft, non-distended EXTREMITIES:  No edema; No deformity   ASSESSMENT AND PLAN:   I have seen [name] in the office today who is being considered for a Watchman left atrial appendage closure device. I believe they will benefit from this procedure given their history of atrial fibrillation, CHA2DS2-VASc score of 6 and unadjusted ischemic stroke rate of 9.7% per year. Unfortunately, the patient is not felt to be a long term anticoagulation candidate secondary to recurrent GI bleeding. The patient's chart has been  reviewed and I feel that they would be a candidate for short term oral anticoagulation after Watchman implant.   It is my belief that after undergoing a LAA closure procedure, [name] will not need long term anticoagulation which eliminates anticoagulation side effects and major bleeding risk.   Procedural risks for the Watchman implant have been reviewed with the patient including a 0.5% risk of stroke, <1% risk of perforation and <1% risk of device embolization. Other risks include bleeding, vascular damage, tamponade,  worsening renal function, and death. The patient understands these risk and wishes to proceed.     The published clinical data on the safety and effectiveness of WATCHMAN include but are not limited to the following: - Holmes DR, Jess BEARD, Sick P et al. for the PROTECT AF Investigators. Percutaneous closure of the left atrial appendage versus warfarin therapy for prevention of stroke in patients with atrial fibrillation: a randomised non-inferiority trial. Lancet 2009; 374: 534-42. GLENWOOD Jess BEARD, Doshi SK, Jonita VEAR Satchel D et al. on behalf of the PROTECT AF Investigators. Percutaneous Left Atrial Appendage Closure for Stroke Prophylaxis in Patients With Atrial Fibrillation 2.3-Year Follow-up of the PROTECT AF (Watchman Left Atrial Appendage System for Embolic Protection in Patients With Atrial Fibrillation) Trial. Circulation 2013; 127:720-729. - Alli O, Doshi S,  Kar S, Reddy VY, Sievert H et al. Quality of Life Assessment in the Randomized PROTECT AF (Percutaneous Closure of the Left Atrial Appendage Versus Warfarin Therapy for Prevention of Stroke in Patients With Atrial Fibrillation) Trial of Patients at Risk for Stroke With Nonvalvular Atrial Fibrillation. J Am Coll Cardiol 2013; 61:1790-8. GLENWOOD Satchel DR, Archer RAMAN, Price M, Whisenant B, Sievert H, Doshi S, Huber K, Reddy V. Prospective randomized evaluation of the Watchman left atrial appendage Device in patients with atrial fibrillation versus long-term warfarin therapy; the PREVAIL trial. Journal of the [employer] of Cardiology, Vol. 4, No. 1, 2014, 1-11. - Kar S, Doshi SK, Sadhu A, Horton R, Osorio J et al. Primary outcome evaluation of a next-generation left atrial appendage closure device: results from the PINNACLE FLX trial. Circulation 2021;143(18)1754-1762.    After today's visit with the patient which was dedicated solely for shared decision making visit regarding LAA closure device, the patient decided to proceed with the LAA appendage  closure procedure scheduled to be done in the near future at [facility]. Prior to the procedure, I would like to obtain a gated CT scan of the chest with contrast timed for PV/LA visualization.   Additionally, the patient will need an updated echocardiogram.  HAS-BLED score 5 Hypertension Yes  Abnormal renal and liver function (Dialysis, transplant, Cr >2.26 mg/dL /Cirrhosis or Bilirubin >2x Normal or AST/ALT/AP >3x Normal) No  Stroke Yes  Bleeding Yes  Labile INR (Unstable/high INR) No  Elderly (>65) Yes  Drugs or alcohol (>= 8 drinks/week, anti-plt or NSAID) Yes   CHA2DS2-VASc Score = 6  The patient's score is based upon: CHF History: 1 HTN History: 1 Diabetes History: 0 Stroke History: 2 Vascular Disease History: 0 Age Score: 2 Gender Score: 0       ASSESSMENT AND PLAN: Permanent Atrial Fibrillation  -Rate control with carvedilol  25mg  BID and digoxin  0.125mg  daily.  Secondary Hypercoagulable State  GI Bleeding H/o CVA -The patient is at significant risk for stroke/thromboembolism based upon his CHA2DS2-VASc Score of 6.  However, the patient is not on anticoagulation due to his high bleeding risk. He is tolerating Ticagrelor  90mg  BID. He needs anti-platelet therapy  for recent strokes. He cannot tolerate DOAC and anti-platelet therapy together. We will start aspirin  81mg  once daily and plan to perform Watchman implant on DAPT.  CHF Hx: NYHA class I  Follow up with Dr. Kennyth 3 months after [facility] implant.   Signed, Fonda Kennyth, MD

## 2024-03-09 ENCOUNTER — Encounter: Payer: Self-pay | Admitting: Cardiology

## 2024-03-09 ENCOUNTER — Ambulatory Visit: Attending: Cardiology | Admitting: Cardiology

## 2024-03-09 VITALS — BP 114/62 | HR 72 | Ht 71.0 in | Wt 200.0 lb

## 2024-03-09 DIAGNOSIS — D6869 Other thrombophilia: Secondary | ICD-10-CM | POA: Insufficient documentation

## 2024-03-09 DIAGNOSIS — I4821 Permanent atrial fibrillation: Secondary | ICD-10-CM | POA: Diagnosis present

## 2024-03-09 DIAGNOSIS — Z8673 Personal history of transient ischemic attack (TIA), and cerebral infarction without residual deficits: Secondary | ICD-10-CM | POA: Diagnosis present

## 2024-03-09 DIAGNOSIS — K922 Gastrointestinal hemorrhage, unspecified: Secondary | ICD-10-CM | POA: Insufficient documentation

## 2024-03-09 MED ORDER — ASPIRIN 81 MG PO TBEC: 81.0000 mg | DELAYED_RELEASE_TABLET | Freq: Every day | ORAL | 6 refills | Status: DC

## 2024-03-09 NOTE — Patient Instructions (Addendum)
 Medication Instructions:  Your physician has recommended you make the following change in your medication:  STOP Plavix STOP Aspirin 325 mg START Aspirin 81 mg  *If you need a refill on your cardiac medications before your next appointment, please call your pharmacy*  Lab Work: None ordered  If you have any lab test that is abnormal or we need to change your treatment, we will call you to review the results.  Testing/Procedures: Your physician has requested that you have cardiac CT. Cardiac computed tomography (CT) is a painless test that uses an x-ray machine to take clear, detailed pictures of your heart. For further information please visit [email or web address] Please follow instruction sheet as given.  Your physician has requested that you have an echocardiogram. Echocardiography is a painless test that uses sound waves to create images of your heart. It provides your doctor with information about the size and shape of your heart and how well your heart's chambers and valves are working. This procedure takes approximately one hour. There are no restrictions for this procedure. Please do NOT wear cologne, perfume, aftershave, or lotions (deodorant is allowed). Please arrive 15 minutes prior to your appointment time.  Please note: We ask at that you not bring children with you during ultrasound (echo/ vascular) testing. Due to room size and safety concerns, children are not allowed in the ultrasound rooms during exams. Our front office staff cannot provide observation of children in our lobby area while testing is being conducted. An adult accompanying a patient to their appointment will only be allowed in the ultrasound room at the discretion of the ultrasound technician under special circumstances. We apologize for any inconvenience.   Follow-Up: You will be contacted by Nurse Navigator, Larkin Plumb to schedule your pre-procedure visit and procedure date. If you have any questions she can  be reached at [phone].    We recommend signing up for the patient portal called "MyChart".  Sign up information is provided on this After Visit Summary.  MyChart is used to connect with patients for Virtual Visits (Telemedicine).  Patients are able to view lab/test results, encounter notes, upcoming appointments, etc.  Non-urgent messages can be sent to your provider as well.   To learn more about what you can do with MyChart, go to [email or web address].   Thank you for choosing [facility]!!

## 2024-03-12 ENCOUNTER — Other Ambulatory Visit: Payer: Self-pay

## 2024-03-12 ENCOUNTER — Telehealth: Payer: Self-pay

## 2024-03-12 DIAGNOSIS — K922 Gastrointestinal hemorrhage, unspecified: Secondary | ICD-10-CM

## 2024-03-12 DIAGNOSIS — Z8673 Personal history of transient ischemic attack (TIA), and cerebral infarction without residual deficits: Secondary | ICD-10-CM

## 2024-03-12 DIAGNOSIS — I4821 Permanent atrial fibrillation: Secondary | ICD-10-CM

## 2024-03-12 NOTE — Telephone Encounter (Signed)
 Called to make plans for Watchman. He needs echo and cCT. Will offer to arrange on same day since he lives in [place].  The patient's voicemail is full and I was unable to leave message. Will try again later.

## 2024-03-12 NOTE — Addendum Note (Signed)
 Addended by: Loneta Tamplin A on: 03/12/2024 04:54 PM   Modules accepted: Orders

## 2024-03-12 NOTE — Telephone Encounter (Signed)
 The patient wished to proceed with LAAO ASAP. Scheduled him for echocardiogram and cCT on 6/13 as he wants to have LAAO on 6/19 if his anatomy is suitable for the device. Confirmed plan with his son as well. They were grateful for assistance.

## 2024-03-13 LAB — CBC WITH DIFFERENTIAL/PLATELET
Basophils Absolute: 0.1 10*3/uL (ref 0.0–0.2)
Basos: 1 %
EOS (ABSOLUTE): 0.1 10*3/uL (ref 0.0–0.4)
Eos: 1 %
Hematocrit: 47.8 % (ref 37.5–51.0)
Hemoglobin: 15.2 g/dL (ref 13.0–17.7)
Immature Grans (Abs): 0 10*3/uL (ref 0.0–0.1)
Immature Granulocytes: 0 %
Lymphocytes Absolute: 1.6 10*3/uL (ref 0.7–3.1)
Lymphs: 18 %
MCH: 29.4 pg (ref 26.6–33.0)
MCHC: 31.8 g/dL (ref 31.5–35.7)
MCV: 93 fL (ref 79–97)
Monocytes Absolute: 0.5 10*3/uL (ref 0.1–0.9)
Monocytes: 6 %
Neutrophils Absolute: 6.9 10*3/uL (ref 1.4–7.0)
Neutrophils: 74 %
Platelets: 276 10*3/uL (ref 150–450)
RBC: 5.17 x10E6/uL (ref 4.14–5.80)
RDW: 13.7 % (ref 11.6–15.4)
WBC: 9.2 10*3/uL (ref 3.4–10.8)

## 2024-03-14 LAB — BASIC METABOLIC PANEL WITH GFR
BUN/Creatinine Ratio: 28 — ABNORMAL HIGH (ref 10–24)
BUN: 28 mg/dL — ABNORMAL HIGH (ref 8–27)
CO2: 17 mmol/L — ABNORMAL LOW (ref 20–29)
Calcium: 9.9 mg/dL (ref 8.6–10.2)
Chloride: 105 mmol/L (ref 96–106)
Creatinine, Ser: 1.01 mg/dL (ref 0.76–1.27)
Glucose: 204 mg/dL — ABNORMAL HIGH (ref 70–99)
Potassium: 4.8 mmol/L (ref 3.5–5.2)
Sodium: 139 mmol/L (ref 134–144)
eGFR: 73 mL/min/{1.73_m2} (ref >59)

## 2024-03-19 ENCOUNTER — Ambulatory Visit: Payer: Self-pay

## 2024-03-19 NOTE — Addendum Note (Signed)
 Addended by: Dazha Kempa A on: 03/19/2024 04:58 PM   Modules accepted: Orders

## 2024-03-23 ENCOUNTER — Ambulatory Visit (HOSPITAL_COMMUNITY)
Admission: RE | Admit: 2024-03-23 | Discharge: 2024-03-23 | Disposition: A | Discharge status: Home / Self Care | Source: Ambulatory Visit | Attending: Cardiology

## 2024-03-23 ENCOUNTER — Ambulatory Visit (HOSPITAL_COMMUNITY): Admission: RE | Admit: 2024-03-23 | Source: Ambulatory Visit

## 2024-03-23 ENCOUNTER — Ambulatory Visit (HOSPITAL_COMMUNITY)
Admission: RE | Admit: 2024-03-23 | Discharge: 2024-03-23 | Disposition: A | Discharge status: Home / Self Care | Source: Ambulatory Visit | Attending: Cardiology | Admitting: Cardiology

## 2024-03-23 ENCOUNTER — Telehealth: Payer: Self-pay

## 2024-03-23 DIAGNOSIS — I4821 Permanent atrial fibrillation: Secondary | ICD-10-CM | POA: Insufficient documentation

## 2024-03-23 DIAGNOSIS — R911 Solitary pulmonary nodule: Secondary | ICD-10-CM | POA: Insufficient documentation

## 2024-03-23 DIAGNOSIS — K449 Diaphragmatic hernia without obstruction or gangrene: Secondary | ICD-10-CM | POA: Insufficient documentation

## 2024-03-23 LAB — ECHOCARDIOGRAM COMPLETE
Est EF: 50
S' Lateral: 3.7 cm

## 2024-03-23 MED ORDER — PERFLUTREN LIPID MICROSPHERE
1.0000 mL | INTRAVENOUS | Status: AC | PRN
  Administered 2024-03-23: 1 mL via INTRAVENOUS

## 2024-03-23 MED ORDER — IOHEXOL 350 MG/ML SOLN
100.0000 mL | Freq: Once | INTRAVENOUS | Status: AC | PRN
  Administered 2024-03-23: 100 mL via INTRAVENOUS

## 2024-03-23 NOTE — Telephone Encounter (Signed)
 Distal filling defect noted Max 23/ AVG 20/ Depth 15 Likely use a 24mm device  Inf/Mid TSP RAO 21 CAU 21

## 2024-03-25 ENCOUNTER — Ambulatory Visit: Payer: Self-pay | Admitting: Cardiology

## 2024-03-26 ENCOUNTER — Telehealth: Payer: Self-pay

## 2024-03-26 NOTE — Telephone Encounter (Signed)
 Per Dr. Daneil Dunker, informed the patient his echo and CT have been reviewed and he may proceed with LAAO 6/19.  Confirmed procedure date of 03/29/2024. Confirmed arrival time of 0700 for procedure time at 0930. Reviewed pre-procedure instructions with patient. He has not been taking Xigduo since his CT scan 6/13 and will not until after his procedure. Confirmed the patient has no contrast allergy and no current PPM/ICD (he had an ICD explanted in 2014). The patient understands to call if questions/concerns arise prior to procedure. He was grateful for call and agreed with plan.

## 2024-03-28 NOTE — Progress Notes (Signed)
 Patient was called to be informed that the procedure time for tomorrow was changed to 07:30 o'clock. Patient was instructed to be at the hospital at 05:30 o'clock. Patient verbalized understanding.

## 2024-03-28 NOTE — Anesthesia Preprocedure Evaluation (Addendum)
 Anesthesia Evaluation  Patient identified by MRN, date of birth, ID band Patient awake    Reviewed: Allergy & Precautions, H&P , NPO status , Patient's Chart, lab work & pertinent test results  History of Anesthesia Complications Negative for: history of anesthetic complications  Airway Mallampati: II  TM Distance: >3 FB Neck ROM: Full    Dental no notable dental hx.    Pulmonary neg sleep apnea, former smoker   Pulmonary exam normal breath sounds clear to auscultation       Cardiovascular hypertension, +CHF  Normal cardiovascular exam+ dysrhythmias Atrial Fibrillation + Cardiac Defibrillator  Rhythm:Regular Rate:Normal  IMPRESSIONS     1. Left ventricular ejection fraction, by estimation, is 50%. The left  ventricle has mildly decreased function. The left ventricle demonstrates  global hypokinesis. Left ventricular diastolic parameters are  indeterminate.   2. Right ventricular systolic function is normal. The right ventricular  size is mildly enlarged. There is mildly elevated pulmonary artery  systolic pressure. The estimated right ventricular systolic pressure is  42.8 mmHg.   3. Left atrial size was mildly dilated.   4. Right atrial size was moderately dilated.   5. The mitral valve is normal in structure. Trivial mitral valve  regurgitation. No evidence of mitral stenosis.   6. The aortic valve is tricuspid. There is mild calcification of the  aortic valve. Aortic valve regurgitation is not visualized. No aortic  stenosis is present.   7. The inferior vena cava is dilated in size with >50% respiratory  variability, suggesting right atrial pressure of 8 mmHg.   8. The patient was in atrial fibrillation.      Neuro/Psych neg Seizures negative neurological ROS  negative psych ROS   GI/Hepatic negative GI ROS, Neg liver ROS,,,  Endo/Other  diabetes, Type 2    Renal/GU Renal InsufficiencyRenal disease  negative  genitourinary   Musculoskeletal negative musculoskeletal ROS (+)    Abdominal   Peds negative pediatric ROS (+)  Hematology negative hematology ROS (+)   Anesthesia Other Findings   Reproductive/Obstetrics negative OB ROS                             Anesthesia Physical Anesthesia Plan  ASA: 3  Anesthesia Plan: General   Post-op Pain Management: Minimal or no pain anticipated   Induction: Intravenous  PONV Risk Score and Plan: 2 and Ondansetron , Dexamethasone and Treatment may vary due to age or medical condition  Airway Management Planned: Oral ETT  Additional Equipment: None  Intra-op Plan:   Post-operative Plan: Extubation in OR  Informed Consent: I have reviewed the patients History and Physical, chart, labs and discussed the procedure including the risks, benefits and alternatives for the proposed anesthesia with the patient or authorized representative who has indicated his/her understanding and acceptance.     Dental advisory given  Plan Discussed with: CRNA  Anesthesia Plan Comments:         Anesthesia Quick Evaluation

## 2024-03-29 ENCOUNTER — Encounter (HOSPITAL_COMMUNITY): Payer: Self-pay | Admitting: Cardiology

## 2024-03-29 ENCOUNTER — Inpatient Hospital Stay (HOSPITAL_COMMUNITY): Payer: Self-pay

## 2024-03-29 ENCOUNTER — Inpatient Hospital Stay (HOSPITAL_COMMUNITY)

## 2024-03-29 ENCOUNTER — Other Ambulatory Visit: Payer: Self-pay

## 2024-03-29 ENCOUNTER — Encounter (HOSPITAL_COMMUNITY)
Admission: RE | Disposition: A | Discharge status: Home / Self Care | Payer: Self-pay | Source: Home / Self Care | Attending: Cardiology

## 2024-03-29 ENCOUNTER — Inpatient Hospital Stay (HOSPITAL_COMMUNITY)
Admission: RE | Admit: 2024-03-29 | Discharge: 2024-03-29 | DRG: 274 | Disposition: A | Discharge status: Home / Self Care | Attending: Cardiology | Admitting: Cardiology

## 2024-03-29 DIAGNOSIS — I428 Other cardiomyopathies: Secondary | ICD-10-CM

## 2024-03-29 DIAGNOSIS — Z8673 Personal history of transient ischemic attack (TIA), and cerebral infarction without residual deficits: Secondary | ICD-10-CM

## 2024-03-29 DIAGNOSIS — I5042 Chronic combined systolic (congestive) and diastolic (congestive) heart failure: Secondary | ICD-10-CM | POA: Diagnosis present

## 2024-03-29 DIAGNOSIS — Z006 Encounter for examination for normal comparison and control in clinical research program: Secondary | ICD-10-CM | POA: Diagnosis not present

## 2024-03-29 DIAGNOSIS — I509 Heart failure, unspecified: Secondary | ICD-10-CM

## 2024-03-29 DIAGNOSIS — Z7902 Long term (current) use of antithrombotics/antiplatelets: Secondary | ICD-10-CM | POA: Diagnosis not present

## 2024-03-29 DIAGNOSIS — I4819 Other persistent atrial fibrillation: Principal | ICD-10-CM

## 2024-03-29 DIAGNOSIS — K922 Gastrointestinal hemorrhage, unspecified: Secondary | ICD-10-CM

## 2024-03-29 DIAGNOSIS — Z7982 Long term (current) use of aspirin: Secondary | ICD-10-CM | POA: Diagnosis not present

## 2024-03-29 DIAGNOSIS — E119 Type 2 diabetes mellitus without complications: Secondary | ICD-10-CM | POA: Diagnosis not present

## 2024-03-29 DIAGNOSIS — I110 Hypertensive heart disease with heart failure: Secondary | ICD-10-CM

## 2024-03-29 DIAGNOSIS — I4821 Permanent atrial fibrillation: Principal | ICD-10-CM

## 2024-03-29 DIAGNOSIS — D6869 Other thrombophilia: Secondary | ICD-10-CM | POA: Diagnosis present

## 2024-03-29 DIAGNOSIS — Z87891 Personal history of nicotine dependence: Secondary | ICD-10-CM

## 2024-03-29 DIAGNOSIS — Z95818 Presence of other cardiac implants and grafts: Secondary | ICD-10-CM | POA: Insufficient documentation

## 2024-03-29 HISTORY — PX: TRANSESOPHAGEAL ECHOCARDIOGRAM (CATH LAB): EP1270

## 2024-03-29 HISTORY — PX: LEFT ATRIAL APPENDAGE OCCLUSION: EP1229

## 2024-03-29 HISTORY — DX: Sleep apnea, unspecified: G47.30

## 2024-03-29 HISTORY — DX: Acute myocardial infarction, unspecified: I21.9

## 2024-03-29 LAB — CBC WITH DIFFERENTIAL/PLATELET
Abs Immature Granulocytes: 0.06 10*3/uL (ref 0.00–0.07)
Basophils Absolute: 0.1 10*3/uL (ref 0.0–0.1)
Basophils Relative: 1 %
Eosinophils Absolute: 0.2 10*3/uL (ref 0.0–0.5)
Eosinophils Relative: 2 %
HCT: 47.9 % (ref 39.0–52.0)
Hemoglobin: 15.4 g/dL (ref 13.0–17.0)
Immature Granulocytes: 1 %
Lymphocytes Relative: 20 %
Lymphs Abs: 2.5 10*3/uL (ref 0.7–4.0)
MCH: 29.7 pg (ref 26.0–34.0)
MCHC: 32.2 g/dL (ref 30.0–36.0)
MCV: 92.5 fL (ref 80.0–100.0)
Monocytes Absolute: 0.8 10*3/uL (ref 0.1–1.0)
Monocytes Relative: 6 %
Neutro Abs: 8.6 10*3/uL — ABNORMAL HIGH (ref 1.7–7.7)
Neutrophils Relative %: 70 %
Platelets: 294 10*3/uL (ref 150–400)
RBC: 5.18 MIL/uL (ref 4.22–5.81)
RDW: 14.2 % (ref 11.5–15.5)
WBC: 12.1 10*3/uL — ABNORMAL HIGH (ref 4.0–10.5)
nRBC: 0 % (ref 0.0–0.2)

## 2024-03-29 LAB — ECHO TEE

## 2024-03-29 LAB — BASIC METABOLIC PANEL WITH GFR
Anion gap: 6 (ref 5–15)
BUN: 21 mg/dL (ref 8–23)
CO2: 26 mmol/L (ref 22–32)
Calcium: 9.8 mg/dL (ref 8.9–10.3)
Chloride: 106 mmol/L (ref 98–111)
Creatinine, Ser: 0.88 mg/dL (ref 0.61–1.24)
GFR, Estimated: >60 mL/min (ref >60)
Glucose, Bld: 191 mg/dL — ABNORMAL HIGH (ref 70–99)
Potassium: 4.3 mmol/L (ref 3.5–5.1)
Sodium: 138 mmol/L (ref 135–145)

## 2024-03-29 LAB — GLUCOSE, CAPILLARY
Glucose-Capillary: 201 mg/dL — ABNORMAL HIGH (ref 70–99)
Glucose-Capillary: 144 mg/dL — ABNORMAL HIGH (ref 70–99)
Glucose-Capillary: 152 mg/dL — ABNORMAL HIGH (ref 70–99)

## 2024-03-29 LAB — TYPE AND SCREEN
ABO/RH(D): A POS
Antibody Screen: NEGATIVE

## 2024-03-29 LAB — SURGICAL PCR SCREEN
MRSA, PCR: NEGATIVE
Staphylococcus aureus: POSITIVE — AB

## 2024-03-29 LAB — POCT ACTIVATED CLOTTING TIME
Activated Clotting Time: 279 s
Activated Clotting Time: 400 s

## 2024-03-29 LAB — ABO/RH: ABO/RH(D): A POS

## 2024-03-29 MED ORDER — PROTAMINE SULFATE 10 MG/ML IV SOLN: INTRAVENOUS | Status: DC | PRN

## 2024-03-29 MED ORDER — ACETAMINOPHEN 10 MG/ML IV SOLN: 1000.0000 mg | Freq: Once | INTRAVENOUS | Status: DC | PRN

## 2024-03-29 MED ORDER — SODIUM CHLORIDE 0.9% FLUSH: 3.0000 mL | Freq: Two times a day (BID) | INTRAVENOUS | Status: DC

## 2024-03-29 MED ORDER — ONDANSETRON HCL 4 MG/2ML IJ SOLN: 4.0000 mg | Freq: Four times a day (QID) | INTRAMUSCULAR | Status: DC | PRN

## 2024-03-29 MED ORDER — CHLORHEXIDINE GLUCONATE CLOTH 2 % EX PADS
6.0000 | MEDICATED_PAD | Freq: Every day | CUTANEOUS | Status: DC
Start: 2024-03-29 — End: 2024-03-29

## 2024-03-29 MED ORDER — HEPARIN (PORCINE) IN NACL 1000-0.9 UT/500ML-% IV SOLN
INTRAVENOUS | Status: DC | PRN
Start: 2024-03-29 — End: 2024-03-29
  Administered 2024-03-29: 500 mL

## 2024-03-29 MED ORDER — HEPARIN SODIUM (PORCINE) 1000 UNIT/ML IJ SOLN
INTRAMUSCULAR | Status: DC | PRN
  Administered 2024-03-29: 3000 [IU] via INTRAVENOUS

## 2024-03-29 MED ORDER — SODIUM CHLORIDE 0.9 % IV SOLN: INTRAVENOUS | Status: DC

## 2024-03-29 MED ORDER — CEFAZOLIN SODIUM-DEXTROSE 2-4 GM/100ML-% IV SOLN
2.0000 g | INTRAVENOUS | Status: AC
  Filled 2024-03-29: qty 100

## 2024-03-29 MED ORDER — LIDOCAINE 2% (20 MG/ML) 5 ML SYRINGE: INTRAMUSCULAR | Status: DC | PRN

## 2024-03-29 MED ORDER — FENTANYL CITRATE (PF) 100 MCG/2ML IJ SOLN: 25.0000 ug | INTRAMUSCULAR | Status: DC | PRN

## 2024-03-29 MED ORDER — IOHEXOL 350 MG/ML SOLN
INTRAVENOUS | Status: DC | PRN
  Administered 2024-03-29: 40 mL

## 2024-03-29 MED ORDER — INSULIN ASPART 100 UNIT/ML IJ SOLN
0.0000 [IU] | INTRAMUSCULAR | Status: DC | PRN
  Administered 2024-03-29: 3 [IU] via SUBCUTANEOUS
  Filled 2024-03-29: qty 1

## 2024-03-29 MED ORDER — MUPIROCIN 2 % EX OINT
1.0000 | TOPICAL_OINTMENT | Freq: Two times a day (BID) | CUTANEOUS | Status: DC
Start: 2024-03-29 — End: 2024-03-29
  Administered 2024-03-29: 1 via NASAL
  Filled 2024-03-29: qty 22

## 2024-03-29 MED ORDER — LACTATED RINGERS IV SOLN
INTRAVENOUS | Status: DC | PRN
Start: 2024-03-29 — End: 2024-03-29

## 2024-03-29 MED ORDER — CARVEDILOL 12.5 MG PO TABS
12.5000 mg | ORAL_TABLET | Freq: Once | ORAL | Status: AC
  Administered 2024-03-29: 12.5 mg via ORAL
  Filled 2024-03-29: qty 1

## 2024-03-29 MED ORDER — ONDANSETRON HCL 4 MG/2ML IJ SOLN: INTRAMUSCULAR | Status: DC | PRN

## 2024-03-29 MED ORDER — TICAGRELOR 90 MG PO TABS
90.0000 mg | ORAL_TABLET | Freq: Once | ORAL | Status: AC
  Administered 2024-03-29: 90 mg via ORAL
  Filled 2024-03-29: qty 1

## 2024-03-29 MED ORDER — LACTATED RINGERS IV SOLN: INTRAVENOUS | Status: DC

## 2024-03-29 MED ORDER — OXYCODONE HCL 5 MG/5ML PO SOLN: 5.0000 mg | Freq: Once | ORAL | Status: DC | PRN

## 2024-03-29 MED ORDER — DEXAMETHASONE SODIUM PHOSPHATE 10 MG/ML IJ SOLN: INTRAMUSCULAR | Status: DC | PRN

## 2024-03-29 MED ORDER — PROPOFOL 10 MG/ML IV BOLUS: INTRAVENOUS | Status: DC | PRN

## 2024-03-29 MED ORDER — FUROSEMIDE 20 MG PO TABS: 20.0000 mg | ORAL_TABLET | Freq: Every day | ORAL | Status: DC | PRN

## 2024-03-29 MED ORDER — ACETAMINOPHEN 325 MG PO TABS: 650.0000 mg | ORAL_TABLET | ORAL | Status: DC | PRN

## 2024-03-29 MED ORDER — ROCURONIUM BROMIDE 10 MG/ML (PF) SYRINGE: PREFILLED_SYRINGE | INTRAVENOUS | Status: DC | PRN

## 2024-03-29 MED ORDER — SODIUM CHLORIDE 0.9% FLUSH: 3.0000 mL | INTRAVENOUS | Status: DC | PRN

## 2024-03-29 MED ORDER — CHLORHEXIDINE GLUCONATE 0.12 % MT SOLN
OROMUCOSAL | Status: AC
  Administered 2024-03-29: 15 mL via OROMUCOSAL
  Filled 2024-03-29: qty 15

## 2024-03-29 MED ORDER — SUGAMMADEX SODIUM 200 MG/2ML IV SOLN: INTRAVENOUS | Status: DC | PRN

## 2024-03-29 MED ORDER — DROPERIDOL 2.5 MG/ML IJ SOLN: 0.6250 mg | Freq: Once | INTRAMUSCULAR | Status: DC | PRN

## 2024-03-29 MED ORDER — OXYCODONE HCL 5 MG PO TABS: 5.0000 mg | ORAL_TABLET | Freq: Once | ORAL | Status: DC | PRN

## 2024-03-29 MED ORDER — HEPARIN (PORCINE) IN NACL 2000-0.9 UNIT/L-% IV SOLN
INTRAVENOUS | Status: DC | PRN
  Administered 2024-03-29: 1000 mL

## 2024-03-29 MED ORDER — SODIUM CHLORIDE 0.9 % IV SOLN: 250.0000 mL | INTRAVENOUS | Status: DC | PRN

## 2024-03-29 MED ORDER — PHENYLEPHRINE HCL-NACL 20-0.9 MG/250ML-% IV SOLN: INTRAVENOUS | Status: DC | PRN

## 2024-03-29 MED ORDER — CHLORHEXIDINE GLUCONATE 0.12 % MT SOLN: 15.0000 mL | Freq: Once | OROMUCOSAL | Status: AC

## 2024-03-29 SURGICAL SUPPLY — 29 items
BLANKET WARM UNDERBOD FULL ACC (MISCELLANEOUS) ×1 IMPLANT
CATH INFINITI 5FR ANG PIGTAIL (CATHETERS) IMPLANT
CATH NUVISION NON NAV ICE (CATHETERS) IMPLANT
CATH WATCHMAN STEER ACCESS SYS (CATHETERS) IMPLANT
CLOSURE PERCLOSE PROSTYLE (VASCULAR PRODUCTS) IMPLANT
CLOSURE VENOUSE MYNX (Vascular Products) IMPLANT
DEVICE WATCHMAN FLX PRO PROC (KITS) IMPLANT
DEVICE WATCHMAN TRUSTEER PROC (KITS) IMPLANT
ELECT DEFIB PAD ADLT CADENCE (PAD) IMPLANT
GUIDEWIRE INQWIRE 1.5J.035X260 (WIRE) ×1 IMPLANT
INTRODUCER PERFORM 12 30 .038 (SHEATH) IMPLANT
KIT HEART LEFT (KITS) ×1 IMPLANT
PACK CARDIAC CATHETERIZATION (CUSTOM PROCEDURE TRAY) ×1 IMPLANT
PAD DEFIB RADIO PHYSIO CONN (PAD) ×1 IMPLANT
SHEATH DILAT COONS TAPER 22F (SHEATH) IMPLANT
SHEATH PERFORMER 18FRX30 (VASCULAR PRODUCTS) IMPLANT
SHEATH PINNACLE 8F 10CM (SHEATH) IMPLANT
SHEATH PINNACLE 9F 10CM (SHEATH) IMPLANT
SHEATH PROBE COVER 6X72 (BAG) ×1 IMPLANT
SHIELD RADPAD SCOOP 12X17 (MISCELLANEOUS) ×1 IMPLANT
SYR CONTROL 10ML ANGIOGRAPHIC (SYRINGE) IMPLANT
TRANSDUCER W/STOPCOCK (MISCELLANEOUS) ×1 IMPLANT
TUBING ART PRESS 72 MALE/FEM (TUBING) IMPLANT
TUBING CIL FLEX 10 FLL-RA (TUBING) ×1 IMPLANT
WATCHMAN FLX PRO 20 (Prosthesis & Implant Heart) IMPLANT
WATCHMAN FLX PRO PROCEDURE (KITS) ×1 IMPLANT
WATCHMAN TRUSTEER PROCEDURE (KITS) ×1 IMPLANT
WIRE AMPLATZ SS-J .035X180CM (WIRE) IMPLANT
WIRE EMERALD 3MM-J .035X150CM (WIRE) IMPLANT

## 2024-03-29 NOTE — Progress Notes (Signed)
 Dr. Daneil Dunker made aware that the patient took Aspirin  this morning but did not take Brilinta as instructed. OK per Dr. Daneil Dunker. No new orders received.

## 2024-03-29 NOTE — Transfer of Care (Signed)
 Immediate Anesthesia Transfer of Care Note  Patient: [name]  Procedure(s) Performed: LEFT ATRIAL APPENDAGE OCCLUSION TRANSESOPHAGEAL ECHOCARDIOGRAM  Patient Location: Cath Lab  Anesthesia Type:General  Level of Consciousness: drowsy and patient cooperative  Airway & Oxygen Therapy: Patient Spontanous Breathing and Patient connected to nasal cannula oxygen  Post-op Assessment: Report given to RN, Post -op Vital signs reviewed and stable, and Patient moving all extremities X 4  Post vital signs: Reviewed and stable  Last Vitals:  Vitals Value Taken Time  BP 105/64 03/29/24 10:20  Temp    Pulse 80 03/29/24 10:23  Resp 15 03/29/24 10:23  SpO2 96 % 03/29/24 10:23  Vitals shown include unfiled device data.  Last Pain:  Vitals:   03/29/24 0623  TempSrc:   PainSc: 0-No pain      Patients Stated Pain Goal: 0 (03/29/24 1610)  Complications: There were no known notable events for this encounter.

## 2024-03-29 NOTE — TOC Transition Note (Signed)
 Transition of Care [facility]) - Discharge Note   Patient Details  Name: [name] MRN: [id number] Date of Birth: 1940/12/15  [facility] CM/SW Contact:  Jeani Mill, RN Phone Number: 03/29/2024, 3:45 PM   Clinical Narrative:    [name] is stable to discharge home.  No TOC needs at this time.   Final next level of care: Home/Self Care Barriers to Discharge: Barriers Resolved   Patient Goals and CMS Choice Patient states their goals for this hospitalization and ongoing recovery are:: Return Home          Discharge Placement               Home        Discharge Plan and Services Additional resources added to the After Visit Summary for                                       Social Drivers of Health (SDOH) Interventions SDOH Screenings   Tobacco Use: Medium Risk (03/29/2024)     Readmission Risk Interventions    03/29/2024    3:44 PM  Readmission Risk Prevention Plan  Post Dischage Appt Complete  Medication Screening Complete  Transportation Screening Complete

## 2024-03-29 NOTE — Plan of Care (Signed)
  Problem: Education: Goal: Knowledge of cardiac device and self-care will improve Outcome: Progressing Goal: Ability to safely manage health related needs after discharge will improve Outcome: Progressing

## 2024-03-29 NOTE — Discharge Summary (Cosign Needed)
 Electrophysiology Discharge Summary   Patient ID: [name],  MRN: [id number], DOB/AGE: 1941/09/19 83 y.o.  Admit date: 03/29/2024 Discharge date: 03/29/2024  Primary Care Physician: Columbus Debar, NP  Primary Cardiologist: None  Electrophysiologist: Manya Sells, MD     Primary Discharge Diagnosis:  Permanent Atrial Fibrillation Poor candidacy for long term anticoagulation due to h/o GI bleeding  Secondary Discharge Diagnosis:  Recurrent GIB  HFrecEF HTN CVA  Procedures This Admission:  Transeptal Puncture Intra-procedural TEE which showed no LAA thrombus Left atrial appendage occlusive device placement on 03/29/24 by Dr. Daneil Dunker.    This study demonstrated: 1.Successful implantation of a 20mm WATCHMAN Flx Pro left atrial appendage occlusive device    2. TEE demonstrating no LAA thrombus 3. No early apparent complications.     Post Implant Anticoagulation Strategy: Aspirin  81mg  and Ticagrelor 90mg  twice daily for the next 6 months. Repeat CT imaging to be performed in 60 days.      Brief HPI: [name] is a 83 y.o. male with a history of Permanent Atrial Fibrillation who was referred to Electrophysiology in the outpatient setting    Hospital Course:  The patient was admitted and underwent left atrial appendage occlusive device placement as above.  The patient was monitored in the post procedure setting and has done very well with no concerns. Given this, he/she is being considered for same day discharge later today. Groin site has been stable without evidence of hematoma or bleeding. Wound care and restrictions were reviewed with the patient.   The patient has been scheduled for post procedure follow up with EP APP in approximately 6 weeks. They will restart ASA 81 mg & Ticagrelor 90 mg BID this evening and continue for 6 months. At that time he will need to coordinate with Neurology given hx of prior CVA if he should remain on ticagrelor alone or ASA (or  plavix).  They will require dental SBE for 6 month post op and should refrain from dental work or cleanings for the first 45 days post implant. SBE to be RXd at follow up.   A repeat CT scan will be performed in approximately 60 days to ensure proper seal of the device.    Physical Exam: Vitals:   03/29/24 1230 03/29/24 1300 03/29/24 1342 03/29/24 1510  BP: 122/71 132/67  126/80  Pulse: 85 88 92 (!) 104  Resp: 18 18  17   Temp:   98.6 F (37 C)   TempSrc:   Oral   SpO2: 95% 95%    Weight:      Height:        GEN: pleasant, well nourished elderly male, well developed in no acute distress NECK: No JVD; No carotid bruits CARDIAC: Irregularly irregular rate and rhythm, no murmurs, rubs, gallops RESPIRATORY:  Clear to auscultation without rales, wheezing or rhonchi  ABDOMEN: Soft, non-tender, non-distended EXTREMITIES:  No edema; No deformity. Groin site Stable     Discharge Medications:  Allergies as of 03/29/2024   No Known Allergies      Medication List     TAKE these medications    amLODipine  10 MG tablet Commonly known as: NORVASC  Take 10 mg by mouth daily.   aspirin  EC 81 MG tablet Take 1 tablet (81 mg total) by mouth daily. Swallow whole.   atorvastatin  20 MG tablet Commonly known as: LIPITOR TAKE 1 TABLET BY MOUTH ONCE  DAILY   Brilinta 90 MG Tabs tablet Generic drug: ticagrelor Take 90 mg by  mouth 2 (two) times daily.   carvedilol  25 MG tablet Commonly known as: COREG  TAKE 2 TABLETS BY MOUTH TWO TIMES DAILY WITH MEALS   digoxin  0.125 MG tablet Commonly known as: LANOXIN  TAKE 1 TABLET BY MOUTH DAILY   furosemide  20 MG tablet Commonly known as: LASIX  Take 1 tablet (20 mg total) by mouth daily as needed (Swelling).   glipiZIDE 5 MG tablet Commonly known as: GLUCOTROL Take 5 mg by mouth daily before breakfast.   losartan  100 MG tablet Commonly known as: COZAAR  Take 100 mg by mouth daily.   naphazoline-glycerin 0.012-0.25 % Soln Commonly known  as: CLEAR EYES REDNESS Place 1-2 drops into both eyes daily as needed for eye irritation.   Nasacort Allergy 24HR 55 MCG/ACT Aero nasal inhaler Generic drug: triamcinolone Place 2 sprays into the nose daily as needed (Congestion).   OVER THE COUNTER MEDICATION Take 30 g by mouth daily. Premier protein shakes   spironolactone  25 MG tablet Commonly known as: ALDACTONE  Take 25 mg by mouth daily.   tamsulosin 0.4 MG Caps capsule Commonly known as: FLOMAX Take 0.4 mg by mouth daily.   Xigduo XR 07-999 MG Tb24 Generic drug: Dapagliflozin Pro-metFORMIN ER Take 1 tablet by mouth daily.        Disposition:  Home with usual follow up as in AVS  Signed, Creighton Doffing, NP-C, AGACNP-BC [facility] - Electrophysiology  03/29/2024, 3:39 PM   I have seen, examined the patient, and reviewed the above assessment and plan.    Hospital Course: Patient presented on 6/19 for scheduled Watchman implant.  Patient went uncomplicated implant of a 20 mm Watchman Flex Pro device.  He underwent the standard post operative monitoring.  There were no obvious acute complications.  He reported feeling well and was discharged home.  General: Well developed, in no acute distress.  Neck: No JVD.  Cardiac: Tachycardic, irregular. Resp: Normal work of breathing.  Groins: Soft bilaterally without hematoma or bleeding.  Ext: No edema.  Neuro: No gross focal deficits.  Psych: Normal affect.   Assessment and Plan:  #. Permanent atrial fibrillation #. Secondary hypercoagulable state due to AF #. Recurrent GI bleeding #. S/p Watchman  - Continue dual antiplatelet therapy with ticagrelor and aspirin  for 6 months, then monotherapy thereafter either ticagrelor or aspirin .  We will repeat imaging with CT scan to assess Watchman device in 60 days.  Duration of Discharge Encounter: 60 minutes  [name], MD 03/30/2024 5:53 PM

## 2024-03-29 NOTE — Interval H&P Note (Signed)
 History and Physical Interval Note:  03/29/2024 7:14 AM  [name]  has presented today for surgery, with the diagnosis of atrial fibrillation and GI bleeding.  The various methods of treatment have been discussed with the patient and family. After consideration of risks, benefits and other options for treatment, the patient has consented to  Procedure(s): LEFT ATRIAL APPENDAGE OCCLUSION (N/A) TRANSESOPHAGEAL ECHOCARDIOGRAM (N/A) as a surgical intervention.  The patient's history has been reviewed, patient examined, no change in status, stable for surgery.  I have reviewed the patient's chart and labs.  Questions were answered to the patient's satisfaction.     [name]

## 2024-03-29 NOTE — Discharge Instructions (Addendum)
 WATCHMANT Procedure, Care After  Procedure MD: Dr. Turner Gains Clinical Coordinator: Larkin Plumb, RN  This sheet gives you information about how to care for yourself after your procedure. Your health care provider may also give you more specific instructions. If you have problems or questions, contact your health care provider.  What can I expect after the procedure? After the procedure, it is common to have: Bruising around your puncture site. Tenderness around your puncture site. Tiredness (fatigue).  Medication instructions It is very important to continue to take your blood thinner as directed by your doctor after the Watchman procedure. Call your procedure doctor's office with question or concerns. Very important that you take your Brilinta and Aspirin  every day and not miss any doses.  Please follow your medication instructions on your discharge summary. Only take the medications listed on your discharge paperwork.  Follow up You will be seen in 6 weeks after your procedure You will have a repeat CT scan or Echocardiogram approximately 8 weeks after your procedure mark to check your device You will follow up the MD/APP who performed your procedure 6 months after your procedure The Watchman Clinical Coordinator will check in with you from time to time, including 1 and 2 years after your procedure.  NO DENTAL CLEANINGS FOR 45 days. After that, you will require antibiotics for dental procedures the first 6 months.   Follow these instructions at home: Puncture site care  Follow instructions from your health care provider about how to take care of your puncture site. Make sure you: If present, leave stitches (sutures), skin glue, or adhesive strips in place.  If a large square bandage is present, this may be removed 24 hours after surgery.  Check your puncture site every day for signs of infection. Check for: Redness, swelling, or pain. Fluid or blood. If your puncture site  starts to bleed, lie down on your back, apply firm pressure to the area, and contact your health care provider. Warmth. Pus or a bad smell. Driving Do not drive yourself home if you received sedation Do not drive for at least 4 days after your procedure or however long your health care provider recommends. (Do not resume driving if you have previously been instructed not to drive for other health reasons.) Do not spend greater than 1 hour at a time in a car for the first 3 days. Stop and take a break with a 5 minute walk at least every hour.  Do not drive or use heavy machinery while taking prescription pain medicine.  Activity Avoid activities that take a lot of effort, including exercise, for at least 7 days after your procedure. For the first 3 days, avoid sitting for longer than one hour at a time.  Avoid alcoholic beverages, signing paperwork, or participating in legal proceedings for 24 hours after receiving sedation Do not lift anything that is heavier than 10 lb (4.5 kg) for one week.  No sexual activity for 1 week.  Return to your normal activities as told by your health care provider. Ask your health care provider what activities are safe for you. General instructions Take over-the-counter and prescription medicines only as told by your health care provider. Do not use any products that contain nicotine or tobacco, such as cigarettes and e-cigarettes. If you need help quitting, ask your health care provider. You may shower after 24 hours, but Do not take baths, swim, or use a hot tub for 1 week.  Do not drink alcohol for  24 hours after your procedure. Keep all follow-up visits as told by your health care provider. This is important. Dental Work: You will require antibiotics prior to any dental work, including cleanings, for 6 months after your Watchman implantation to help protect you from infection. After 6 months, antibiotics are no longer required. Contact a health care provider  if: You have redness, mild swelling, or pain around your puncture site. You have soreness in your throat or at your puncture site that does not improve after several days You have fluid or blood coming from your puncture site that stops after applying firm pressure to the area. Your puncture site feels warm to the touch. You have pus or a bad smell coming from your puncture site. You have a fever. You have chest pain or discomfort that spreads to your neck, jaw, or arm. You are sweating a lot. You feel nauseous. You have a fast or irregular heartbeat. You have shortness of breath. You are dizzy or light-headed and feel the need to lie down. You have pain or numbness in the arm or leg closest to your puncture site. Get help right away if: Your puncture site suddenly swells. Your puncture site is bleeding and the bleeding does not stop after applying firm pressure to the area. These symptoms may represent a serious problem that is an emergency. Do not wait to see if the symptoms will go away. Get medical help right away. Call your local emergency services (911 in the [place] Do not drive yourself to the hospital. Summary After the procedure, it is normal to have bruising and tenderness at the puncture site in your groin, neck, or forearm. Check your puncture site every day for signs of infection. Get help right away if your puncture site is bleeding and the bleeding does not stop after applying firm pressure to the area. This is a medical emergency.  This information is not intended to replace advice given to you by your health care provider. Make sure you discuss any questions you have with your health care provider.

## 2024-03-29 NOTE — Anesthesia Procedure Notes (Signed)
 Procedure Name: Intubation Date/Time: 03/29/2024 7:38 AM  Performed by: Rochelle Chu, CRNAPre-anesthesia Checklist: Patient identified, Emergency Drugs available, Suction available and Patient being monitored Patient Re-evaluated:Patient Re-evaluated prior to induction Oxygen Delivery Method: Circle system utilized Preoxygenation: Pre-oxygenation with 100% oxygen Induction Type: IV induction Ventilation: Mask ventilation without difficulty Laryngoscope Size: Mac and 4 Grade View: Grade I Tube type: Oral Tube size: 7.5 mm Number of attempts: 1 Airway Equipment and Method: Stylet and Oral airway Placement Confirmation: ETT inserted through vocal cords under direct vision, positive ETCO2 and breath sounds checked- equal and bilateral Secured at: 22 cm Tube secured with: Tape Dental Injury: Teeth and Oropharynx as per pre-operative assessment

## 2024-03-29 NOTE — Anesthesia Postprocedure Evaluation (Signed)
 Anesthesia Post Note  Patient: [name]  Procedure(s) Performed: LEFT ATRIAL APPENDAGE OCCLUSION TRANSESOPHAGEAL ECHOCARDIOGRAM     Patient location during evaluation: PACU Anesthesia Type: General Level of consciousness: awake and alert Pain management: pain level controlled Vital Signs Assessment: post-procedure vital signs reviewed and stable Respiratory status: spontaneous breathing, nonlabored ventilation, respiratory function stable and patient connected to nasal cannula oxygen Cardiovascular status: blood pressure returned to baseline and stable Postop Assessment: no apparent nausea or vomiting Anesthetic complications: no   There were no known notable events for this encounter.  Last Vitals:  Vitals:   03/29/24 1300 03/29/24 1342  BP: 132/67   Pulse: 88 92  Resp: 18   Temp:  37 C  SpO2: 95%     Last Pain:  Vitals:   03/29/24 1342  TempSrc: Oral  PainSc:                  [name]

## 2024-04-02 ENCOUNTER — Telehealth: Payer: Self-pay

## 2024-04-02 DIAGNOSIS — I4821 Permanent atrial fibrillation: Secondary | ICD-10-CM

## 2024-04-02 DIAGNOSIS — Z95818 Presence of other cardiac implants and grafts: Secondary | ICD-10-CM

## 2024-04-02 NOTE — Telephone Encounter (Signed)
 Called to check on patient, who had LAAO 03/29/2024.  Left message to call back.

## 2024-04-06 NOTE — Addendum Note (Signed)
 Addended by: Evangelene Vora A on: 04/06/2024 03:16 PM   Modules accepted: Orders

## 2024-04-06 NOTE — Telephone Encounter (Signed)
 Left message to call back. Will confirm post-procedure visit and arrange 60-day CT.

## 2024-04-06 NOTE — Telephone Encounter (Signed)
  [facility]   Watch[facility] Team  Contacted the patient regarding discharge from [facility] on 03/29/2024  The patient understands to follow up with Daphne Barrack on 05/15/2024  The patient understands discharge instructions? Yes  The patient understands medications and regimen? Yes   The patient reports groin site looks healthy with no S/S of infection or bleeding  The patient understands to call with any questions or concerns prior to scheduled visit.

## 2024-04-20 ENCOUNTER — Other Ambulatory Visit (HOSPITAL_COMMUNITY)

## 2024-05-12 NOTE — Progress Notes (Unsigned)
  Electrophysiology Office Note:   Date:  05/15/2024  ID:  [name], DOB 07-26-1941, MRN [id number]  Primary Cardiologist: None Primary Heart Failure: None Electrophysiologist: Danelle Birmingham, MD, Dr. Kennyth      History of Present Illness:   [name] is a 83 y.o. male with h/o AF, HFrecEF, CAD, HLD, DM II, former smoker, recurrent GIB seen today for routine electrophysiology follow-up s/p LAAO.   He underwent LAAO on 03/29/24 by Dr. Kennyth.  Since that time the patient reports doing very well. He notes he bleeds easily on current medications at the slightest bump.  He had one nose bleed but it was easily stopped.   He denies chest pain, palpitations, dyspnea, PND, orthopnea, nausea, vomiting, dizziness, syncope, edema, weight gain, or early satiety.    Review of systems complete and found to be negative unless listed in HPI.   EP Information / Studies Reviewed:    EKG is not ordered today. EKG from 03/29/24 reviewed which showed AF, 73 bpm      Arrhythmia / AAD AF  LAAO 03/29/24 > implantation of 20 mm Watchman FLX Pro, no LAA thrombus   Risk Assessment/Calculations:    CHA2DS2-VASc Score = 6   This indicates a 9.7% annual risk of stroke. The patient's score is based upon: CHF History: 1 HTN History: 1 Diabetes History: 0 Stroke History: 2 Vascular Disease History: 0 Age Score: 2 Gender Score: 0             Physical Exam:   VS:  BP (!) 106/52 (BP Location: Left Arm, Patient Position: Sitting, Cuff Size: Large)   Pulse 77   Ht 5' 11 (1.803 m)   Wt 202 lb 3.2 oz (91.7 kg)   SpO2 97%   BMI 28.20 kg/m    Wt Readings from Last 3 Encounters:  05/15/24 202 lb 3.2 oz (91.7 kg)  03/29/24 200 lb (90.7 kg)  03/09/24 200 lb (90.7 kg)     GEN: Well nourished, well developed in no acute distress NECK: No JVD; No carotid bruits CARDIAC: Regular rate and rhythm, no murmurs, rubs, gallops RESPIRATORY:  Clear to auscultation without rales, wheezing or  rhonchi  ABDOMEN: Soft, non-tender, non-distended EXTREMITIES:  No edema; No deformity   ASSESSMENT AND PLAN:    Permanent Atrial Fibrillation  LAA Occlusion  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   -SBE for dental procedures discussed / prescribed   Hx CVA's  -follows with Dr. Steffan Junior in [place], [place] [phone]  Fatigue  -BP soft normal, may be contributing > reduce Norvasc  to 5mg  daily and reassess   Follow up with Dr. Kennyth / EP APP for 6 month follow up   Signed, Daphne Barrack, NP-C, AGACNP-BC [facility] - Electrophysiology  05/15/2024, 5:39 PM   Addendum 05/17/24 1107 am : Called Dr. Junior and spoke to him regarding agent of choice given patients history of CVA at the end of LAAO 6 month window.  He recommends the patient to remain on ticagrelor  alone.

## 2024-05-15 ENCOUNTER — Encounter: Payer: Self-pay | Admitting: Pulmonary Disease

## 2024-05-15 ENCOUNTER — Ambulatory Visit: Attending: Pulmonary Disease | Admitting: Pulmonary Disease

## 2024-05-15 VITALS — BP 106/52 | HR 77 | Ht 71.0 in | Wt 202.2 lb

## 2024-05-15 DIAGNOSIS — Z95818 Presence of other cardiac implants and grafts: Secondary | ICD-10-CM | POA: Insufficient documentation

## 2024-05-15 DIAGNOSIS — I4821 Permanent atrial fibrillation: Secondary | ICD-10-CM | POA: Insufficient documentation

## 2024-05-15 MED ORDER — AMLODIPINE BESYLATE 5 MG PO TABS: 5.0000 mg | ORAL_TABLET | Freq: Every day | ORAL | 3 refills | Status: AC

## 2024-05-15 MED ORDER — AMOXICILLIN 500 MG PO TABS: 2000.0000 mg | ORAL_TABLET | ORAL | 1 refills | Status: AC | PRN

## 2024-05-15 NOTE — Patient Instructions (Signed)
 Medication Instructions:  Take amoxicillin  2,000 mg (4 tablets) 1 hour prior prior to all dental visits. Decrease amlodipine  (Norvasc ) to 5 mg daily. *If you need a refill on your cardiac medications before your next appointment, please call your pharmacy*  Lab Work: BMET-TODAY If you have labs (blood work) drawn today and your tests are completely normal, you will receive your results only by: MyChart Message (if you have MyChart) OR A paper copy in the mail If you have any lab test that is abnormal or we need to change your treatment, we will call you to review the results.  Testing/Procedures: See letter  Follow-Up: At [facility], you and your health needs are our priority.  As part of our continuing mission to provide you with exceptional heart care, our providers are all part of one team.  This team includes your primary Cardiologist (physician) and Advanced Practice Providers or APPs (Physician Assistants and Nurse Practitioners) who all work together to provide you with the care you need, when you need it.  Your next appointment:   As scheduled  We recommend signing up for the patient portal called MyChart.  Sign up information is provided on this After Visit Summary.  MyChart is used to connect with patients for Virtual Visits (Telemedicine).  Patients are able to view lab/test results, encounter notes, upcoming appointments, etc.  Non-urgent messages can be sent to your provider as well.   To learn more about what you can do with MyChart, go to [email or web address].

## 2024-05-16 ENCOUNTER — Ambulatory Visit: Payer: Self-pay | Admitting: *Deleted

## 2024-05-16 LAB — BASIC METABOLIC PANEL WITH GFR
BUN/Creatinine Ratio: 32 — ABNORMAL HIGH (ref 10–24)
BUN: 30 mg/dL — ABNORMAL HIGH (ref 8–27)
CO2: 20 mmol/L (ref 20–29)
Calcium: 9.6 mg/dL (ref 8.6–10.2)
Chloride: 105 mmol/L (ref 96–106)
Creatinine, Ser: 0.93 mg/dL (ref 0.76–1.27)
Glucose: 92 mg/dL (ref 70–99)
Potassium: 4.9 mmol/L (ref 3.5–5.2)
Sodium: 141 mmol/L (ref 134–144)
eGFR: 80 mL/min/1.73 (ref >59)

## 2024-06-01 ENCOUNTER — Ambulatory Visit (HOSPITAL_COMMUNITY)
Admission: RE | Admit: 2024-06-01 | Discharge: 2024-06-01 | Disposition: A | Discharge status: Home / Self Care | Source: Ambulatory Visit | Attending: Cardiology | Admitting: Cardiology

## 2024-06-01 DIAGNOSIS — I4821 Permanent atrial fibrillation: Secondary | ICD-10-CM | POA: Insufficient documentation

## 2024-06-01 DIAGNOSIS — Z95818 Presence of other cardiac implants and grafts: Secondary | ICD-10-CM | POA: Insufficient documentation

## 2024-06-01 MED ORDER — IOHEXOL 350 MG/ML SOLN
100.0000 mL | Freq: Once | INTRAVENOUS | Status: AC | PRN
  Administered 2024-06-01: 100 mL via INTRAVENOUS

## 2024-06-02 ENCOUNTER — Ambulatory Visit: Payer: Self-pay | Admitting: Cardiology

## 2024-06-13 ENCOUNTER — Other Ambulatory Visit: Payer: Self-pay | Admitting: Internal Medicine

## 2024-09-21 ENCOUNTER — Ambulatory Visit: Attending: Internal Medicine | Admitting: Internal Medicine

## 2024-09-21 ENCOUNTER — Encounter: Payer: Self-pay | Admitting: Internal Medicine

## 2024-09-21 VITALS — BP 120/72 | HR 81 | Ht 71.5 in | Wt 205.6 lb

## 2024-09-21 DIAGNOSIS — I482 Chronic atrial fibrillation, unspecified: Secondary | ICD-10-CM | POA: Diagnosis not present

## 2024-09-21 NOTE — Progress Notes (Signed)
 HPI [name] returns today for ongoing evaluation and management of his atrial fibrillation. He has a h/o non-ischemic CM and then normalization of his LV function. He opted not to have a new ICD placed. His old generator was removed but the leads were left in place. The patient has recovered fairly well from his old stroke. He does not have palpitations. He had GI bleeding on anti-coagulation. I referred him to Dr. Kennyth and he underwent LAA occlusion. He feels well.  Allergies[1]   Current Outpatient Medications  Medication Sig Dispense Refill   amLODipine  (NORVASC ) 5 MG tablet Take 1 tablet (5 mg total) by mouth daily. 90 tablet 3   amoxicillin  (AMOXIL ) 500 MG tablet Take 4 tablets (2,000 mg total) by mouth as needed (1 hr prior to dental procedures). 4 tablet 1   aspirin  325 MG tablet Take 325 mg by mouth daily.     atorvastatin  (LIPITOR) 20 MG tablet TAKE 1 TABLET BY MOUTH ONCE  DAILY 90 tablet 3   carvedilol  (COREG ) 25 MG tablet TAKE 2 TABLETS BY MOUTH TWO TIMES DAILY WITH MEALS 360 tablet 3   digoxin  (LANOXIN ) 0.125 MG tablet TAKE 1 TABLET BY MOUTH DAILY 90 tablet 3   furosemide  (LASIX ) 20 MG tablet Take 1 tablet (20 mg total) by mouth daily as needed (Swelling).     glipiZIDE (GLUCOTROL) 5 MG tablet Take 5 mg by mouth daily before breakfast.      losartan  (COZAAR ) 100 MG tablet Take 100 mg by mouth daily.     naphazoline-glycerin (CLEAR EYES REDNESS) 0.012-0.25 % SOLN Place 1-2 drops into both eyes daily as needed for eye irritation.     OVER THE COUNTER MEDICATION Take 30 g by mouth daily. Premier protein shakes     spironolactone  (ALDACTONE ) 25 MG tablet Take 25 mg by mouth daily.     tamsulosin (FLOMAX) 0.4 MG CAPS capsule Take 0.4 mg by mouth daily.     triamcinolone (NASACORT ALLERGY 24HR) 55 MCG/ACT AERO nasal inhaler Place 2 sprays into the nose daily as needed (Congestion).     XIGDUO XR 07-999 MG TB24 Take 1 tablet by mouth daily.     aspirin  EC 81 MG tablet  Take 1 tablet (81 mg total) by mouth daily. Swallow whole. (Patient not taking: Reported on 09/21/2024) 30 tablet 6   BRILINTA  90 MG TABS tablet Take 90 mg by mouth 2 (two) times daily. (Patient not taking: Reported on 09/21/2024)     No current facility-administered medications for this visit.     Past Medical History:  Diagnosis Date   Arteriosclerotic cardiovascular disease (ASCVD)     non-critical with LM, LAD, RCA <50%; 75% Cx in a small vessel   Atrial fibrillation (HCC)    2006: epistaxis, UGI bleed and renal hematoma on coumadin->partial nephrectomy; subsequently has refused anticoagulation   Chronic kidney disease    +ARF, but creat. 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   Past Surgical History:  Procedure Laterality Date   CARDIAC DEFIBRILLATOR PLACEMENT     Dr. Waddell 08/2005.  [employer]   CATARACT EXTRACTION  10/11/2019   COLONOSCOPY  2012   Negative screening study; remote history of polypectomy   GENERATOR REMOVAL N/A 08/20/2013   Procedure: LEAD CAP OFF;  Surgeon: Danelle LELON Waddell, MD;  Location: [facility] CATH LAB;  Service: Cardiovascular;  Laterality: N/A;   LEFT ATRIAL APPENDAGE OCCLUSION N/A 03/29/2024   Procedure: LEFT ATRIAL APPENDAGE OCCLUSION;  Surgeon: Kennyth Chew, MD;  Location: [facility] INVASIVE CV LAB;  Service: Cardiovascular;  Laterality: N/A;   PARTIAL NEPHRECTOMY     TRANSESOPHAGEAL ECHOCARDIOGRAM (CATH LAB) N/A  03/29/2024   Procedure: TRANSESOPHAGEAL ECHOCARDIOGRAM;  Surgeon: Kennyth Chew, MD;  Location: [facility] INVASIVE CV LAB;  Service: Cardiovascular;  Laterality: N/A;     No family history on file.   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  Tobacco Use   Smoking status: Former    Current packs/day: 0.00    Average packs/day: 1.5 packs/day for 40.0 years (60.0 ttl pk-yrs)    Types: Cigarettes    Start date: 10/11/1953    Quit date: 10/11/1993    Years since quitting: 30.9   Smokeless tobacco: Never  Vaping Use   Vaping status: Never Used  Substance and Sexual Activity   Alcohol use: Yes    Alcohol/week: 1.0 standard drink of alcohol    Types: 1 drink(s) per week   Drug use: No   Sexual activity: Not on file  Other Topics Concern   Not on file  Social History Narrative   Not on file   Social Drivers of Health   Tobacco Use: Medium Risk (09/21/2024)   Patient History    Smoking Tobacco Use: Former    Smokeless Tobacco Use: Never    Passive Exposure: Not on Actuary Strain: Not on file  Food Insecurity: Not on file  Transportation Needs: Not on file  Physical Activity: Not on file  Stress: Not on file  Social Connections: Not on file  Intimate Partner Violence: Not on file  Depression (PHQ2-9): Not on file  Alcohol Screen: Not on file  Housing: Not on file  Utilities: Not on file  Health Literacy: Not on file     BP 120/72 (BP Location: Right Arm)   Pulse 81   Ht 5' 11.5 (1.816 m)   Wt 205 lb 9.6 oz (93.3 kg)   SpO2 94%   BMI 28.28 kg/m   Physical Exam:  Well appearing NAD HEENT: Unremarkable Neck:  No JVD, no thyromegally Lymphatics:  No adenopathy Back:  No CVA tenderness Lungs:  Clear with no  wheezes HEART:  Regular rate rhythm, no murmurs, no rubs, no clicks Abd:  soft, positive bowel sounds, no organomegally, no rebound, no guarding Ext:  2 plus pulses, no edema, no cyanosis, no clubbing Skin:  No rashes no nodules Neuro:  CN II through XII intact, motor grossly intact  DEVICE  Normal device function.  See PaceArt for details.   Assess/Plan: 1. Atrial fib -  his rates are controlled. He is unable to take systemic anti-coagulation due to GI bleeding.He has undergone Watchman and is doing well 2. HTN - his bp is now well controlled on medical therapy. He may ultimately need to have his amlodipine  stopped. 3. Obesity - resolved.  4. Dyslipidemia - he will continue his lipitor 20 mg daily.    Danelle Zyshawn Bohnenkamp,MD    [1] No Known Allergies

## 2024-09-21 NOTE — Patient Instructions (Signed)
 Medication Instructions:  Your physician recommends that you continue on your current medications as directed. Please refer to the Current Medication list given to you today.   Labwork: None today  Testing/Procedures: None today  Follow-Up: 1 year Dr.Carlisle  Any Other Special Instructions Will Be Listed Below (If Applicable).  If you need a refill on your cardiac medications before your next appointment, please call your pharmacy.

## 2024-09-25 ENCOUNTER — Telehealth: Payer: Self-pay

## 2024-09-25 NOTE — Telephone Encounter (Addendum)
 Reviewed patient's chart as he is nearing 6 month post LAAO. Patient had 81mg  aspirin , 325mg  aspirin  and Brilinta  listed on his med list.   After speaking with the patient, he reports stopping Brilinta  and 81mg  ASA in October 2025. He started taking 325mg  ASA daily instead. Patient stated he made this choice on his own. He stated his neurologist and cardiologist were going back and forth what to do and no one gave him a straight answer.   Patient stated he just saw Dr. Waddell who did not mention an issue with his medications. Advised patient will get clarification from Dr. Kennyth as to what medication he should be taking 6 month post LAAO.  His medication list was updated to reflect what is currently taking.

## 2024-10-01 NOTE — Telephone Encounter (Signed)
 Spoke with patient. Advised Dr. Kennyth would like him on some type of antiplatelet medication. Patient will speak with his Neurologist to see if they have a preference. Patient will keep Cardiology updated if he changes medications.

## 2024-10-22 ENCOUNTER — Other Ambulatory Visit: Payer: Self-pay | Admitting: Internal Medicine

## 2024-10-22 DIAGNOSIS — I428 Other cardiomyopathies: Secondary | ICD-10-CM
